# Patient Record
Sex: Female | Born: 1985
Health system: Southern US, Community
[De-identification: ages and names within clinical notes are randomized; demographics above are authoritative.]

## PROBLEM LIST (undated history)

## (undated) DIAGNOSIS — N760 Acute vaginitis: Secondary | ICD-10-CM

## (undated) DIAGNOSIS — A599 Trichomoniasis, unspecified: Secondary | ICD-10-CM

## (undated) DIAGNOSIS — B9689 Other specified bacterial agents as the cause of diseases classified elsewhere: Secondary | ICD-10-CM

---
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
## 2007-11-18 ENCOUNTER — Emergency Department (HOSPITAL_COMMUNITY): Admission: EM | Admit: 2007-11-18 | Discharge: 2007-11-18 | Payer: Self-pay | Admitting: Emergency Medicine

## 2010-05-09 LAB — RUBELLA ANTIBODY, IGM: Rubella: IMMUNE

## 2010-05-09 LAB — HEPATITIS B SURFACE ANTIGEN: Hepatitis B Surface Ag: NEGATIVE

## 2010-05-09 LAB — RPR
RPR: NONREACTIVE
RPR: NONREACTIVE

## 2010-06-03 ENCOUNTER — Other Ambulatory Visit: Payer: Self-pay | Admitting: Obstetrics and Gynecology

## 2010-06-03 DIAGNOSIS — Z0489 Encounter for examination and observation for other specified reasons: Secondary | ICD-10-CM

## 2010-06-03 DIAGNOSIS — R772 Abnormality of alphafetoprotein: Secondary | ICD-10-CM

## 2010-06-05 ENCOUNTER — Other Ambulatory Visit: Payer: Self-pay | Admitting: Obstetrics and Gynecology

## 2010-06-05 ENCOUNTER — Ambulatory Visit (HOSPITAL_COMMUNITY)
Admission: RE | Admit: 2010-06-05 | Discharge: 2010-06-05 | Disposition: A | Discharge status: Home / Self Care | Payer: Medicaid Other | Source: Ambulatory Visit | Attending: Obstetrics and Gynecology | Admitting: Obstetrics and Gynecology

## 2010-06-05 DIAGNOSIS — R772 Abnormality of alphafetoprotein: Secondary | ICD-10-CM

## 2010-06-05 DIAGNOSIS — Z363 Encounter for antenatal screening for malformations: Secondary | ICD-10-CM | POA: Insufficient documentation

## 2010-06-05 DIAGNOSIS — O358XX0 Maternal care for other (suspected) fetal abnormality and damage, not applicable or unspecified: Secondary | ICD-10-CM | POA: Insufficient documentation

## 2010-06-05 DIAGNOSIS — Z0489 Encounter for examination and observation for other specified reasons: Secondary | ICD-10-CM

## 2010-06-05 DIAGNOSIS — O3500X0 Maternal care for (suspected) central nervous system malformation or damage in fetus, unspecified, not applicable or unspecified: Secondary | ICD-10-CM | POA: Insufficient documentation

## 2010-06-05 DIAGNOSIS — O350XX0 Maternal care for (suspected) central nervous system malformation in fetus, not applicable or unspecified: Secondary | ICD-10-CM | POA: Insufficient documentation

## 2010-06-05 DIAGNOSIS — O34219 Maternal care for unspecified type scar from previous cesarean delivery: Secondary | ICD-10-CM | POA: Insufficient documentation

## 2010-06-05 DIAGNOSIS — IMO0002 Reserved for concepts with insufficient information to code with codable children: Secondary | ICD-10-CM

## 2010-06-05 DIAGNOSIS — Z1389 Encounter for screening for other disorder: Secondary | ICD-10-CM | POA: Insufficient documentation

## 2010-06-05 DIAGNOSIS — IMO0001 Reserved for inherently not codable concepts without codable children: Secondary | ICD-10-CM | POA: Insufficient documentation

## 2010-06-13 NOTE — Discharge Summary (Signed)
NAME:  Sara Coleman, Sara Coleman                           ACCOUNT NO.:  [id number]   MEDICAL RECORD NO.:  [id number]                   PATIENT TYPE:  INP   LOCATION:  9111                                 FACILITY:  [facility]   PHYSICIAN:  Lawerance Sabal, MD                   DATE OF BIRTH:  February 16, 1985   DATE OF ADMISSION:  04/26/2003  DATE OF DISCHARGE:  04/29/2003                                 DISCHARGE SUMMARY   DISCHARGE DIAGNOSES:  1. Intrauterine pregnancy, delivered, term, cephalic, live birth.  2. Dysfunctional labor with arrest of dilatation, intrapartal fever.   PROCEDURE:  Primary low transverse Cesarean section.   REVIEW OF HISTORY:  This is a 25 year old, G1, P0, who presented at [redacted] weeks  gestation, in early labor.   On examination, vital signs revealed a blood pressure of 130/71, pulse rate  of 93, respiratory rate 18, temperature 98.6. Examination showed the abdomen  in large age of gestation. Cervix was noted to be 2-3 cm dilated, 70%  effaced, -2 station with intact bag of waters. Heart tracing showed baseline  fetal heart rate of 130 with variability and occasional variable  decelerations. Contractions were coming every four to six minutes, mild to  moderate, lasting 30 to 40 seconds in duration. Assessment was a 17-year-  old, G1, P0, intrauterine pregnancy at 40 weeks and 1 day, in early labor.   HOSPITAL COURSE:  The patient's labor was observed, and ampicillin IV was  given because of GBS positive results. An intrauterine pregnancy catheter  was inserted and Pitocin was started to augment contractions. Fetal heart  rate tracing was noted to have decreased variability; however, there was  note of accelerations with decelerations.  However, in spite of adequate MVU  for two and a half hours the patient remained at 6 cm dilated for two and a  half hours. At this time the patient was noted to be febrile with a  temperature of 101.8. Fetal heart tracings were noted to shift from  a  baseline of 145 to 160 to 170 with variable decelerations and reduced  variability.  Thus the patient underwent a primary segment Cesarean section  for arrest of dilatation and delivered a live baby girl with Apgar score of  8 becoming 9 with a cord pH of 7.31. There was note of cloudy amniotic  fluid. The patient was started on ampicillin 2 gm IV and gentamicin 100 mg  IV, followed by 160 mg IV q.8h. for two doses postoperatively. On the second  postoperative day, the patient became afebrile with no complaints.  The  uterus was nontender. Postoperative hemoglobin was 8.3. The patient was  started on iron supplements.   CONDITION ON DISCHARGE:  The patient was discharged with stable vital signs,  afebrile with __________ and ambulating well. The incision was noted to be  well opposed with no discharge, tenderness, or erythema. There  was no  abdominal tenderness.   DISPOSITION:  The patient is discharged to home.   DISCHARGE INSTRUCTIONS:  The patient is to follow up with Dr. Evonnie Pat at the  [facility] after six weeks. The patient was undecided with  regards to birth control on the day of discharge. Dr. Evonnie Pat will address  this on her postpartum visit.   DISCHARGE MEDICATIONS:  1. Ibuprofen 600 mg p.o. q.i.d. p.r.n. pain.  2. Percocet one tablet p.o. q.i.d. if necessary pain.  3. Ferrous sulfate t.i.d.                                               Lawerance Sabal, MD    MC/MEDQ  D:  05/25/2003  T:  05/25/2003  Job:  [id number]

## 2010-06-13 NOTE — Op Note (Signed)
NAME:  Sara Coleman, Sara Coleman                           ACCOUNT NO.:  [id number]   MEDICAL RECORD NO.:  [id number]                   PATIENT TYPE:  INP   LOCATION:  9111                                 FACILITY:  [facility]   PHYSICIAN:  Lesly Dukes, M.D.              DATE OF BIRTH:  August 27, 1985   DATE OF PROCEDURE:  04/26/2003  DATE OF DISCHARGE:                                 OPERATIVE REPORT   PREOPERATIVE DIAGNOSIS:  A 25 year old para 0 female at term with __________  maternal temperature.   POSTOPERATIVE DIAGNOSIS:  A 25 year old para 0 female at term with  __________ maternal temperature.   PROCEDURE:  Primary low flap transverse cesarean section.   SURGEON:  Lesly Dukes, M.D.   ASSISTANT:  Dr. Miles Costain.   ANESTHESIA:  Epidural.   ESTIMATED BLOOD LOSS:  800 cc.   COMPLICATIONS:  None.   PATHOLOGY:  Placenta.   FINDINGS:  Viable female infant, Apgars 8 at one minute, 9 at five minutes.  Arterial cord pH equals 7.31; vertex; cloudy amniotic fluid; ROA  presentation; peds at delivery; grossly normal uterus, ovaries and fallopian  tubes.   PROCEDURE:  After informed consent was obtained, the patient was taken to  the operating room where epidural anesthesia was found to be adequate.  The  patient was placed in the dorsal supine position with a leftward tilt.  Foley was already in the bladder.  The patient was then prepared and draped  in the normal sterile fashion.  A Pfannenstiel skin incision was made with a  scalpel.  This was carried down to the underlying layer of fascia.  The  fascia was incised in the midline, and this was extended bilaterally.  The  superior-inferior aspects of the fascial incision were grasped with Kocher  clamps, tented up, and dissected off sharply and bluntly from the underlying  layers of rectus muscles.  The rectus muscles were separated in the midline.  The peritoneum was identified, tented up, entered sharply with Metzenbaum  scissors.   This incision was extended both superiorly and inferiorly with  good visualization of the bladder.  The bladder blade was inserted.  The  vesicouterine peritoneum was identified, tented up, entered sharply with  Metzenbaum scissors.  This incision was extended bilaterally.  The bladder  flap was created digitally.  The bladder blade was reinserted.  The uterine  incision was made in a transverse fashion in the lower uterine segment with  a scalpel.  This incision was extended bilaterally bluntly.  The baby's head  was delivered without incident.  Nose and mouth were suctioned.  The rest of  the baby's body delivered easily.  The cord was clamped and cut.  The baby  was handed off to the awaiting pediatrician.   Cord blood was sent for type and screen.  Cord gas was also sent.  The  findings were as above.  The placenta delivered spontaneously with three-  vessel cord and sent to pathology.  The uterus was exteriorized and cleared  of all clots and debris.  Then 0 Vicryl was used to close the uterine  incision in a running locked fashion.  A second imbricating layer was then  used to help aid in hemostasis.  The uterus was returned to the abdomen and  noted to be hemostatic off tension.  The gutters were cleared of all clots  and debris.  The rectus muscle, fascia, bladder flap and peritoneum were  noted to be hemostatic.  The fascia was closed with 0 Vicryl in a running  fashion.  The subcutaneous tissue was copiously irrigated.  The skin was  closed with staples.   The patient tolerated the procedure well.  Sponge, lap, instrument and  needle count were correct x 2.  The patient went to the recovery room in  stable condition.                                               Lesly Dukes, M.D.    Lora Paula  D:  04/26/2003  T:  04/27/2003  Job:  [id number]

## 2010-06-18 ENCOUNTER — Other Ambulatory Visit (HOSPITAL_COMMUNITY): Payer: Self-pay

## 2010-06-18 ENCOUNTER — Encounter (HOSPITAL_COMMUNITY): Payer: Self-pay

## 2010-07-17 ENCOUNTER — Other Ambulatory Visit: Payer: Self-pay | Admitting: Obstetrics and Gynecology

## 2010-07-17 ENCOUNTER — Ambulatory Visit (HOSPITAL_COMMUNITY)
Admission: RE | Admit: 2010-07-17 | Discharge: 2010-07-17 | Disposition: A | Discharge status: Home / Self Care | Payer: Medicaid Other | Source: Ambulatory Visit | Attending: Obstetrics and Gynecology | Admitting: Obstetrics and Gynecology

## 2010-07-17 DIAGNOSIS — IMO0001 Reserved for inherently not codable concepts without codable children: Secondary | ICD-10-CM | POA: Insufficient documentation

## 2010-07-17 DIAGNOSIS — Z0489 Encounter for examination and observation for other specified reasons: Secondary | ICD-10-CM

## 2010-07-17 DIAGNOSIS — O350XX0 Maternal care for (suspected) central nervous system malformation in fetus, not applicable or unspecified: Secondary | ICD-10-CM | POA: Insufficient documentation

## 2010-07-17 DIAGNOSIS — O34219 Maternal care for unspecified type scar from previous cesarean delivery: Secondary | ICD-10-CM | POA: Insufficient documentation

## 2010-07-17 DIAGNOSIS — O3500X0 Maternal care for (suspected) central nervous system malformation or damage in fetus, unspecified, not applicable or unspecified: Secondary | ICD-10-CM | POA: Insufficient documentation

## 2010-07-17 DIAGNOSIS — O358XX0 Maternal care for other (suspected) fetal abnormality and damage, not applicable or unspecified: Secondary | ICD-10-CM | POA: Insufficient documentation

## 2010-08-07 ENCOUNTER — Ambulatory Visit (HOSPITAL_COMMUNITY)
Admission: RE | Admit: 2010-08-07 | Discharge: 2010-08-07 | Disposition: A | Discharge status: Home / Self Care | Payer: Medicaid Other | Source: Ambulatory Visit | Attending: Obstetrics and Gynecology | Admitting: Obstetrics and Gynecology

## 2010-08-07 ENCOUNTER — Encounter (HOSPITAL_COMMUNITY): Payer: Self-pay

## 2010-08-07 DIAGNOSIS — Z0489 Encounter for examination and observation for other specified reasons: Secondary | ICD-10-CM

## 2010-08-07 DIAGNOSIS — O34219 Maternal care for unspecified type scar from previous cesarean delivery: Secondary | ICD-10-CM | POA: Insufficient documentation

## 2010-08-07 DIAGNOSIS — IMO0002 Reserved for concepts with insufficient information to code with codable children: Secondary | ICD-10-CM

## 2010-08-07 DIAGNOSIS — IMO0001 Reserved for inherently not codable concepts without codable children: Secondary | ICD-10-CM | POA: Insufficient documentation

## 2010-08-07 DIAGNOSIS — O350XX0 Maternal care for (suspected) central nervous system malformation in fetus, not applicable or unspecified: Secondary | ICD-10-CM | POA: Insufficient documentation

## 2010-08-07 DIAGNOSIS — O3500X0 Maternal care for (suspected) central nervous system malformation or damage in fetus, unspecified, not applicable or unspecified: Secondary | ICD-10-CM | POA: Insufficient documentation

## 2010-08-07 DIAGNOSIS — O358XX0 Maternal care for other (suspected) fetal abnormality and damage, not applicable or unspecified: Secondary | ICD-10-CM

## 2010-08-28 ENCOUNTER — Ambulatory Visit (HOSPITAL_COMMUNITY)
Admission: RE | Admit: 2010-08-28 | Discharge: 2010-08-28 | Disposition: A | Discharge status: Home / Self Care | Payer: Medicaid Other | Source: Ambulatory Visit | Attending: Obstetrics and Gynecology | Admitting: Obstetrics and Gynecology

## 2010-08-28 ENCOUNTER — Other Ambulatory Visit (HOSPITAL_COMMUNITY): Payer: Self-pay | Admitting: Maternal and Fetal Medicine

## 2010-08-28 VITALS — BP 122/71 | HR 81 | Wt 164.0 lb

## 2010-08-28 DIAGNOSIS — IMO0001 Reserved for inherently not codable concepts without codable children: Secondary | ICD-10-CM | POA: Insufficient documentation

## 2010-08-28 DIAGNOSIS — O34219 Maternal care for unspecified type scar from previous cesarean delivery: Secondary | ICD-10-CM | POA: Insufficient documentation

## 2010-08-28 DIAGNOSIS — O358XX0 Maternal care for other (suspected) fetal abnormality and damage, not applicable or unspecified: Secondary | ICD-10-CM

## 2010-08-28 DIAGNOSIS — O3500X0 Maternal care for (suspected) central nervous system malformation or damage in fetus, unspecified, not applicable or unspecified: Secondary | ICD-10-CM | POA: Insufficient documentation

## 2010-08-28 DIAGNOSIS — O350XX0 Maternal care for (suspected) central nervous system malformation in fetus, not applicable or unspecified: Secondary | ICD-10-CM | POA: Insufficient documentation

## 2010-08-28 NOTE — Progress Notes (Signed)
Report in [facility]-OBGYN/EPIC; follow-up 4 weeks.

## 2010-09-03 ENCOUNTER — Ambulatory Visit (HOSPITAL_COMMUNITY)
Admission: RE | Admit: 2010-09-03 | Discharge: 2010-09-03 | Disposition: A | Discharge status: Home / Self Care | Payer: Medicaid Other | Source: Ambulatory Visit | Attending: Obstetrics & Gynecology | Admitting: Obstetrics & Gynecology

## 2010-09-03 ENCOUNTER — Other Ambulatory Visit (HOSPITAL_COMMUNITY): Payer: Self-pay | Admitting: Obstetrics & Gynecology

## 2010-09-03 DIAGNOSIS — O409XX0 Polyhydramnios, unspecified trimester, not applicable or unspecified: Secondary | ICD-10-CM | POA: Insufficient documentation

## 2010-09-03 DIAGNOSIS — O358XX0 Maternal care for other (suspected) fetal abnormality and damage, not applicable or unspecified: Secondary | ICD-10-CM

## 2010-09-03 DIAGNOSIS — IMO0001 Reserved for inherently not codable concepts without codable children: Secondary | ICD-10-CM | POA: Insufficient documentation

## 2010-09-03 DIAGNOSIS — O34219 Maternal care for unspecified type scar from previous cesarean delivery: Secondary | ICD-10-CM | POA: Insufficient documentation

## 2010-09-03 DIAGNOSIS — O3500X0 Maternal care for (suspected) central nervous system malformation or damage in fetus, unspecified, not applicable or unspecified: Secondary | ICD-10-CM | POA: Insufficient documentation

## 2010-09-03 DIAGNOSIS — O350XX0 Maternal care for (suspected) central nervous system malformation in fetus, not applicable or unspecified: Secondary | ICD-10-CM | POA: Insufficient documentation

## 2010-09-03 NOTE — Progress Notes (Signed)
Patient seen for ultrasound only appointment today.  Please see [facility]-OBGYN report for details.  

## 2010-09-09 ENCOUNTER — Inpatient Hospital Stay (HOSPITAL_COMMUNITY)
Admission: AD | Admit: 2010-09-09 | Discharge: 2010-09-09 | Disposition: A | Discharge status: Home / Self Care | Payer: Medicaid Other | Source: Ambulatory Visit | Attending: Obstetrics and Gynecology | Admitting: Obstetrics and Gynecology

## 2010-09-09 ENCOUNTER — Encounter (HOSPITAL_COMMUNITY): Payer: Self-pay | Admitting: *Deleted

## 2010-09-09 DIAGNOSIS — Z36 Encounter for antenatal screening of mother: Secondary | ICD-10-CM

## 2010-09-09 DIAGNOSIS — O368390 Maternal care for abnormalities of the fetal heart rate or rhythm, unspecified trimester, not applicable or unspecified: Secondary | ICD-10-CM | POA: Insufficient documentation

## 2010-09-09 DIAGNOSIS — Z3689 Encounter for other specified antenatal screening: Secondary | ICD-10-CM

## 2010-09-09 DIAGNOSIS — O4700 False labor before 37 completed weeks of gestation, unspecified trimester: Secondary | ICD-10-CM | POA: Insufficient documentation

## 2010-09-09 LAB — FETAL FIBRONECTIN: Fetal Fibronectin: NEGATIVE

## 2010-09-09 NOTE — ED Provider Notes (Signed)
History     No chief complaint on file.  HPI Presents from office for extended monitoring due to a FHR decel seen in office. Feels some tightening but no painful contractions. No leaking or bleeding. + FM   Past Medical History  Diagnosis Date  . Migraine     Past Surgical History  Procedure Date  . Cesarean section     No family history on file.  History  Substance Use Topics  . Smoking status: Never Smoker   . Smokeless tobacco: Not on file  . Alcohol Use: No    Allergies: No Known Allergies  Prescriptions prior to admission  Medication Sig Dispense Refill  . acetaminophen (TYLENOL) 325 MG tablet Take 650 mg by mouth every 6 (six) hours as needed. As needed for pain       . Prenatal MV-Min-Fe Fum-FA-DHA (PRENATAL 1 PO) Take 1 tablet by mouth daily.         ROS Negative except tightening.  Physical Exam   Blood pressure 120/65, pulse 78, temperature 99 F (37.2 C), temperature source Oral, resp. rate 18, height 5' 4.25" (1.632 m), weight 76.261 kg (168 lb 2 oz).  Physical Exam  Nursing note and vitals reviewed. Constitutional: She is oriented to person, place, and time. She appears well-developed and well-nourished.  HENT:  Head: Normocephalic.  Cardiovascular: Normal rate.   Respiratory: Effort normal.  GI: Soft.  Genitourinary: Vagina normal. No vaginal discharge found.  Neurological: She is alert and oriented to person, place, and time.  Skin: Skin is warm and dry.  Psychiatric: She has a normal mood and affect.  FHR reassuring with no decels at present. UCs q 3 minutes, not perceived as painful Cervix FT/50%/-3/?presenting part   MAU Course  Procedures  MDM   Assessment and Plan  Discussed with Dr Chilton Si. WIll send FFn and monitor for at least one hour.   Ovetta Bazzano 09/09/2010, 5:22 PM   FHR reactive with no decels. There are scattered dips of 10-20 beats that last only 10 seconds. UCs are irregular FFn is Negative D/W Dr Chilton Si >> will  send home.

## 2010-09-09 NOTE — Progress Notes (Signed)
Pt states, " I was at the office at 1430 today, and the baby had a drop in hear trate while doing an NST."

## 2010-09-25 ENCOUNTER — Ambulatory Visit (HOSPITAL_COMMUNITY)
Admission: RE | Admit: 2010-09-25 | Discharge: 2010-09-25 | Disposition: A | Discharge status: Home / Self Care | Payer: Medicaid Other | Source: Ambulatory Visit | Attending: Obstetrics and Gynecology | Admitting: Obstetrics and Gynecology

## 2010-09-25 ENCOUNTER — Other Ambulatory Visit: Payer: Self-pay | Admitting: Obstetrics and Gynecology

## 2010-09-25 DIAGNOSIS — O358XX0 Maternal care for other (suspected) fetal abnormality and damage, not applicable or unspecified: Secondary | ICD-10-CM | POA: Insufficient documentation

## 2010-09-25 DIAGNOSIS — IMO0001 Reserved for inherently not codable concepts without codable children: Secondary | ICD-10-CM

## 2010-09-25 DIAGNOSIS — O3500X0 Maternal care for (suspected) central nervous system malformation or damage in fetus, unspecified, not applicable or unspecified: Secondary | ICD-10-CM | POA: Insufficient documentation

## 2010-09-25 DIAGNOSIS — O409XX0 Polyhydramnios, unspecified trimester, not applicable or unspecified: Secondary | ICD-10-CM | POA: Insufficient documentation

## 2010-09-25 DIAGNOSIS — O34219 Maternal care for unspecified type scar from previous cesarean delivery: Secondary | ICD-10-CM | POA: Insufficient documentation

## 2010-09-25 DIAGNOSIS — O350XX0 Maternal care for (suspected) central nervous system malformation in fetus, not applicable or unspecified: Secondary | ICD-10-CM | POA: Insufficient documentation

## 2010-09-25 NOTE — Progress Notes (Signed)
Vital signs reviewed; see ultrasound report 

## 2010-10-03 ENCOUNTER — Ambulatory Visit (HOSPITAL_COMMUNITY)
Admission: RE | Admit: 2010-10-03 | Discharge: 2010-10-03 | Disposition: A | Discharge status: Home / Self Care | Payer: Medicaid Other | Source: Ambulatory Visit | Attending: Obstetrics and Gynecology | Admitting: Obstetrics and Gynecology

## 2010-10-03 DIAGNOSIS — IMO0001 Reserved for inherently not codable concepts without codable children: Secondary | ICD-10-CM

## 2010-10-03 DIAGNOSIS — O365990 Maternal care for other known or suspected poor fetal growth, unspecified trimester, not applicable or unspecified: Secondary | ICD-10-CM | POA: Insufficient documentation

## 2010-10-03 DIAGNOSIS — O409XX0 Polyhydramnios, unspecified trimester, not applicable or unspecified: Secondary | ICD-10-CM

## 2010-10-03 DIAGNOSIS — O358XX0 Maternal care for other (suspected) fetal abnormality and damage, not applicable or unspecified: Secondary | ICD-10-CM

## 2010-10-03 DIAGNOSIS — O350XX0 Maternal care for (suspected) central nervous system malformation in fetus, not applicable or unspecified: Secondary | ICD-10-CM

## 2010-10-03 DIAGNOSIS — O34219 Maternal care for unspecified type scar from previous cesarean delivery: Secondary | ICD-10-CM

## 2010-10-03 DIAGNOSIS — O3500X0 Maternal care for (suspected) central nervous system malformation or damage in fetus, unspecified, not applicable or unspecified: Secondary | ICD-10-CM

## 2010-10-03 NOTE — Progress Notes (Signed)
Ultrasound in [facility]/OBGYN/EPIC.  Follow up U/S scheduled 

## 2010-10-10 ENCOUNTER — Ambulatory Visit (HOSPITAL_COMMUNITY)
Admission: RE | Admit: 2010-10-10 | Discharge: 2010-10-10 | Disposition: A | Discharge status: Home / Self Care | Payer: Medicaid Other | Source: Ambulatory Visit | Attending: Obstetrics and Gynecology | Admitting: Obstetrics and Gynecology

## 2010-10-10 DIAGNOSIS — O358XX0 Maternal care for other (suspected) fetal abnormality and damage, not applicable or unspecified: Secondary | ICD-10-CM | POA: Insufficient documentation

## 2010-10-10 DIAGNOSIS — O365990 Maternal care for other known or suspected poor fetal growth, unspecified trimester, not applicable or unspecified: Secondary | ICD-10-CM | POA: Insufficient documentation

## 2010-10-10 DIAGNOSIS — O409XX0 Polyhydramnios, unspecified trimester, not applicable or unspecified: Secondary | ICD-10-CM | POA: Insufficient documentation

## 2010-10-10 DIAGNOSIS — O34219 Maternal care for unspecified type scar from previous cesarean delivery: Secondary | ICD-10-CM | POA: Insufficient documentation

## 2010-10-10 DIAGNOSIS — O3500X0 Maternal care for (suspected) central nervous system malformation or damage in fetus, unspecified, not applicable or unspecified: Secondary | ICD-10-CM | POA: Insufficient documentation

## 2010-10-10 DIAGNOSIS — O350XX0 Maternal care for (suspected) central nervous system malformation in fetus, not applicable or unspecified: Secondary | ICD-10-CM | POA: Insufficient documentation

## 2010-10-10 DIAGNOSIS — IMO0001 Reserved for inherently not codable concepts without codable children: Secondary | ICD-10-CM | POA: Insufficient documentation

## 2010-10-10 NOTE — Progress Notes (Signed)
Patient seen for ultrasound only appointment today.  Please see [facility]-OBGYN report for details.  

## 2010-10-13 NOTE — Progress Notes (Signed)
Encounter addended by: Marlana Latus, RN on: 10/13/2010  2:27 PM<BR>     Documentation filed: Episodes

## 2010-10-17 ENCOUNTER — Ambulatory Visit (HOSPITAL_COMMUNITY)
Admission: RE | Admit: 2010-10-17 | Discharge: 2010-10-17 | Disposition: A | Discharge status: Home / Self Care | Payer: Medicaid Other | Source: Ambulatory Visit | Attending: Obstetrics and Gynecology | Admitting: Obstetrics and Gynecology

## 2010-10-17 DIAGNOSIS — IMO0001 Reserved for inherently not codable concepts without codable children: Secondary | ICD-10-CM | POA: Insufficient documentation

## 2010-10-17 DIAGNOSIS — O365990 Maternal care for other known or suspected poor fetal growth, unspecified trimester, not applicable or unspecified: Secondary | ICD-10-CM | POA: Insufficient documentation

## 2010-10-17 DIAGNOSIS — O350XX0 Maternal care for (suspected) central nervous system malformation in fetus, not applicable or unspecified: Secondary | ICD-10-CM

## 2010-10-17 DIAGNOSIS — O3500X0 Maternal care for (suspected) central nervous system malformation or damage in fetus, unspecified, not applicable or unspecified: Secondary | ICD-10-CM | POA: Insufficient documentation

## 2010-10-17 DIAGNOSIS — O34219 Maternal care for unspecified type scar from previous cesarean delivery: Secondary | ICD-10-CM | POA: Insufficient documentation

## 2010-10-17 DIAGNOSIS — O409XX0 Polyhydramnios, unspecified trimester, not applicable or unspecified: Secondary | ICD-10-CM | POA: Insufficient documentation

## 2010-10-17 DIAGNOSIS — O358XX0 Maternal care for other (suspected) fetal abnormality and damage, not applicable or unspecified: Secondary | ICD-10-CM

## 2010-10-17 NOTE — Progress Notes (Signed)
Ultrasound in [facility]/OBGYN/EPIC.  Follow up U/S scheduled 

## 2010-10-22 NOTE — Progress Notes (Signed)
Encounter addended by: Marlana Latus, RN on: 10/22/2010  3:51 PM<BR>     Documentation filed: Episodes, Chief Complaint Section

## 2010-10-24 ENCOUNTER — Other Ambulatory Visit (HOSPITAL_COMMUNITY): Payer: Self-pay | Admitting: Maternal and Fetal Medicine

## 2010-10-24 ENCOUNTER — Ambulatory Visit (HOSPITAL_COMMUNITY)
Admission: RE | Admit: 2010-10-24 | Discharge: 2010-10-24 | Disposition: A | Discharge status: Home / Self Care | Payer: Medicaid Other | Source: Ambulatory Visit | Attending: Obstetrics and Gynecology | Admitting: Obstetrics and Gynecology

## 2010-10-24 DIAGNOSIS — O358XX0 Maternal care for other (suspected) fetal abnormality and damage, not applicable or unspecified: Secondary | ICD-10-CM | POA: Insufficient documentation

## 2010-10-24 DIAGNOSIS — O350XX0 Maternal care for (suspected) central nervous system malformation in fetus, not applicable or unspecified: Secondary | ICD-10-CM

## 2010-10-24 DIAGNOSIS — O409XX0 Polyhydramnios, unspecified trimester, not applicable or unspecified: Secondary | ICD-10-CM | POA: Insufficient documentation

## 2010-10-24 DIAGNOSIS — O365990 Maternal care for other known or suspected poor fetal growth, unspecified trimester, not applicable or unspecified: Secondary | ICD-10-CM | POA: Insufficient documentation

## 2010-10-24 DIAGNOSIS — O34219 Maternal care for unspecified type scar from previous cesarean delivery: Secondary | ICD-10-CM | POA: Insufficient documentation

## 2010-10-24 DIAGNOSIS — O3500X0 Maternal care for (suspected) central nervous system malformation or damage in fetus, unspecified, not applicable or unspecified: Secondary | ICD-10-CM | POA: Insufficient documentation

## 2010-10-24 DIAGNOSIS — IMO0001 Reserved for inherently not codable concepts without codable children: Secondary | ICD-10-CM | POA: Insufficient documentation

## 2010-10-27 ENCOUNTER — Encounter (HOSPITAL_COMMUNITY): Payer: Self-pay | Admitting: *Deleted

## 2010-10-27 ENCOUNTER — Inpatient Hospital Stay (HOSPITAL_COMMUNITY): Payer: Medicaid Other

## 2010-10-27 ENCOUNTER — Inpatient Hospital Stay (HOSPITAL_COMMUNITY)
Admission: AD | Admit: 2010-10-27 | Discharge: 2010-10-30 | DRG: 765 | Disposition: A | Discharge status: Home / Self Care | Payer: Medicaid Other | Source: Ambulatory Visit | Attending: Obstetrics and Gynecology | Admitting: Obstetrics and Gynecology

## 2010-10-27 DIAGNOSIS — O351XX0 Maternal care for (suspected) chromosomal abnormality in fetus, not applicable or unspecified: Secondary | ICD-10-CM | POA: Diagnosis present

## 2010-10-27 DIAGNOSIS — O3510X0 Maternal care for (suspected) chromosomal abnormality in fetus, unspecified, not applicable or unspecified: Secondary | ICD-10-CM | POA: Diagnosis present

## 2010-10-27 DIAGNOSIS — O34219 Maternal care for unspecified type scar from previous cesarean delivery: Secondary | ICD-10-CM | POA: Diagnosis present

## 2010-10-27 DIAGNOSIS — O409XX0 Polyhydramnios, unspecified trimester, not applicable or unspecified: Secondary | ICD-10-CM | POA: Diagnosis present

## 2010-10-27 DIAGNOSIS — O364XX0 Maternal care for intrauterine death, not applicable or unspecified: Principal | ICD-10-CM | POA: Diagnosis present

## 2010-10-27 DIAGNOSIS — O2690 Pregnancy related conditions, unspecified, unspecified trimester: Secondary | ICD-10-CM

## 2010-10-27 LAB — CBC
MCH: 28.9 pg (ref 26.0–34.0)
MCHC: 33.1 g/dL (ref 30.0–36.0)
Platelets: 213 10*3/uL (ref 150–400)

## 2010-10-27 MED ORDER — CEFAZOLIN SODIUM-DEXTROSE 2-3 GM-% IV SOLR
2.0000 g | INTRAVENOUS | Status: DC
  Filled 2010-10-27: qty 50

## 2010-10-27 MED ORDER — LACTATED RINGERS IV SOLN
INTRAVENOUS | Status: DC
  Administered 2010-10-27 – 2010-10-28 (×2): via INTRAVENOUS

## 2010-10-27 MED ORDER — ACETAMINOPHEN 500 MG PO TABS
1000.0000 mg | ORAL_TABLET | Freq: Once | ORAL | Status: AC
  Administered 2010-10-27: 1000 mg via ORAL
  Filled 2010-10-27: qty 2

## 2010-10-27 NOTE — Progress Notes (Signed)
Pt G3 P1 at 39.5wks, reports no fetal movement since Saturday night.

## 2010-10-27 NOTE — Progress Notes (Signed)
[place] tech at bedside per MD orders.

## 2010-10-27 NOTE — Progress Notes (Signed)
No cardiac activity seen on [place].

## 2010-10-27 NOTE — Progress Notes (Signed)
Dr. Paul Half at bedside. Assessment done and poc discussed with pt.

## 2010-10-27 NOTE — H&P (Signed)
Principal Diagnosis:  IUP at [redacted]w[redacted]d, IUFD, multiple fetal anomalies, h/o cesarean section.  HPI:  Patient presented to MAU with cc of no fetal movement for 4 days.  The patient denies vaginal bleeding, contractions, leakage of fluid.    PMH:  None PSH:  H/o cesarean section POB:  G3P1011, h/o c/s for breech x1, TAB x1. PGYN:  No h/o STD's or abnormal paps in prenatal records Meds:  None Allergies:  NKDA  PE:    Vitals:  99.3, 129/84, 90, 18. RRR CTAB Gravid, nontender. LE's:  Non tender, homan's neg, no evid dvt.     WBC:  7.5, H/H  12/36.3, PLT 213.  Coags:  PTT  26, PT 12.2, T&S pending.    Prenatal Labs:  O+/RI/NR/pap wnl.  Bedside U/S:  PA performed u/s previously that demonstrated no fetal heart tones.  Official u/s requested; performed while I was observing in the room.  There was no FHR detected on u/s.  No fetal movement.  No overlapping skull bones, mild skin edema.  There is a pleural effusion present as well.  A/P:  I discussed the findings of the u/s with the patient and her family.  The patient and I had a long discussion regarding options for delivery.  I discussed the risks of VBAC vs. Repeat C/S.  The R/B/A's of each were discussed, keeping in mind the fact that our exclusive focus would be on the patient in labor rather than the fetus.  I discussed the risk of uterine rupture and of subsequent potential hemorrhage, but noted that these are low risks.  Additionally, I discussed the risks of c/s including bleeding, infection, possible transfusion, risks of anesthesia, medical complications of surgery including blood clots, stroke, heart attack, death, damage to internal abdominal structures.  After this discussion, the patient opted for repeat c/s.  I feel that, given the difficult circumstances in which the patient and family now find themselves, this is a reasonable option.  The patient ate a sandwich and potato chips at 6:30 p.m.  I discussed this with anesthesia and the  plan is to bring the patient back to the operating room at 6:30 a.m.  Routine labs, including coags ordered.  NPO.  Will proceed with c/s in the a.m.  Will discuss with patient option of obtaining tissue sample for genetic analysis if desired.  Patient and family informed of options, including burial vs hospital disposing of the fetus.

## 2010-10-27 NOTE — Progress Notes (Signed)
Jenean Lindau, PA at bedside.  Bedside US done.

## 2010-10-27 NOTE — Progress Notes (Signed)
Pt to room 172 via wheelchair.

## 2010-10-27 NOTE — ED Notes (Signed)
Unable to dopple FHR, pt to room 4 for informal U/S.

## 2010-10-27 NOTE — Progress Notes (Signed)
Pt go to room 172. Will call when room is ready.

## 2010-10-28 ENCOUNTER — Encounter (HOSPITAL_COMMUNITY): Payer: Self-pay | Admitting: *Deleted

## 2010-10-28 ENCOUNTER — Other Ambulatory Visit: Payer: Self-pay | Admitting: Obstetrics and Gynecology

## 2010-10-28 ENCOUNTER — Inpatient Hospital Stay (HOSPITAL_COMMUNITY): Payer: Medicaid Other | Admitting: Anesthesiology

## 2010-10-28 ENCOUNTER — Encounter (HOSPITAL_COMMUNITY)
Admission: AD | Disposition: A | Discharge status: Home / Self Care | Payer: Self-pay | Source: Ambulatory Visit | Attending: Obstetrics and Gynecology

## 2010-10-28 ENCOUNTER — Encounter (HOSPITAL_COMMUNITY): Payer: Self-pay | Admitting: Anesthesiology

## 2010-10-28 LAB — TYPE AND SCREEN: Antibody Screen: NEGATIVE

## 2010-10-28 LAB — ABO/RH: ABO/RH(D): O POS

## 2010-10-28 LAB — RPR: RPR Ser Ql: NONREACTIVE

## 2010-10-28 SURGERY — Surgical Case
Anesthesia: Regional | Site: Abdomen | Wound class: Clean Contaminated

## 2010-10-28 MED ORDER — KETOROLAC TROMETHAMINE 60 MG/2ML IM SOLN
INTRAMUSCULAR | Status: AC
  Administered 2010-10-28: 60 mg via INTRAMUSCULAR
  Filled 2010-10-28: qty 2

## 2010-10-28 MED ORDER — SODIUM CHLORIDE 0.9 % IJ SOLN: 3.0000 mL | INTRAMUSCULAR | Status: DC | PRN

## 2010-10-28 MED ORDER — KETOROLAC TROMETHAMINE 30 MG/ML IJ SOLN
30.0000 mg | Freq: Four times a day (QID) | INTRAMUSCULAR | Status: AC | PRN
  Administered 2010-10-28: 30 mg via INTRAVENOUS
  Filled 2010-10-28: qty 1

## 2010-10-28 MED ORDER — ACETAMINOPHEN 10 MG/ML IV SOLN
1000.0000 mg | Freq: Four times a day (QID) | INTRAVENOUS | Status: AC | PRN
  Filled 2010-10-28: qty 100

## 2010-10-28 MED ORDER — SCOPOLAMINE 1 MG/3DAYS TD PT72
MEDICATED_PATCH | TRANSDERMAL | Status: AC
  Administered 2010-10-28: 1.5 mg via TRANSDERMAL
  Filled 2010-10-28: qty 1

## 2010-10-28 MED ORDER — ONDANSETRON HCL 4 MG/2ML IJ SOLN
4.0000 mg | INTRAMUSCULAR | Status: DC | PRN
  Administered 2010-10-28: 4 mg via INTRAVENOUS

## 2010-10-28 MED ORDER — METOCLOPRAMIDE HCL 5 MG/ML IJ SOLN: 10.0000 mg | Freq: Three times a day (TID) | INTRAMUSCULAR | Status: DC | PRN

## 2010-10-28 MED ORDER — KETOROLAC TROMETHAMINE 30 MG/ML IJ SOLN: 30.0000 mg | Freq: Four times a day (QID) | INTRAMUSCULAR | Status: AC | PRN

## 2010-10-28 MED ORDER — LANOLIN HYDROUS EX OINT: 1.0000 "application " | TOPICAL_OINTMENT | CUTANEOUS | Status: DC | PRN

## 2010-10-28 MED ORDER — EPHEDRINE 5 MG/ML INJ
INTRAVENOUS | Status: AC
  Filled 2010-10-28: qty 10

## 2010-10-28 MED ORDER — DIPHENHYDRAMINE HCL 50 MG/ML IJ SOLN: 12.5000 mg | INTRAMUSCULAR | Status: DC | PRN

## 2010-10-28 MED ORDER — FENTANYL CITRATE 0.05 MG/ML IJ SOLN
INTRAMUSCULAR | Status: DC | PRN
  Administered 2010-10-28: 25 ug via INTRATHECAL

## 2010-10-28 MED ORDER — SIMETHICONE 80 MG PO CHEW
80.0000 mg | CHEWABLE_TABLET | ORAL | Status: DC | PRN
  Administered 2010-10-28: 80 mg via ORAL

## 2010-10-28 MED ORDER — WITCH HAZEL-GLYCERIN EX PADS: 1.0000 "application " | MEDICATED_PAD | CUTANEOUS | Status: DC | PRN

## 2010-10-28 MED ORDER — DIPHENHYDRAMINE HCL 25 MG PO CAPS
25.0000 mg | ORAL_CAPSULE | Freq: Four times a day (QID) | ORAL | Status: DC | PRN
  Administered 2010-10-28: 25 mg via ORAL
  Filled 2010-10-28: qty 1

## 2010-10-28 MED ORDER — PHENYLEPHRINE HCL 10 MG/ML IJ SOLN
INTRAMUSCULAR | Status: DC | PRN
  Administered 2010-10-28 (×2): 40 ug via INTRAVENOUS
  Administered 2010-10-28 (×2): 80 ug via INTRAVENOUS
  Administered 2010-10-28 (×7): 40 ug via INTRAVENOUS
  Administered 2010-10-28 (×3): 80 ug via INTRAVENOUS

## 2010-10-28 MED ORDER — KETOROLAC TROMETHAMINE 30 MG/ML IJ SOLN: 15.0000 mg | Freq: Once | INTRAMUSCULAR | Status: DC | PRN

## 2010-10-28 MED ORDER — MENTHOL 3 MG MT LOZG: 1.0000 | LOZENGE | OROMUCOSAL | Status: DC | PRN

## 2010-10-28 MED ORDER — DIPHENHYDRAMINE HCL 50 MG/ML IJ SOLN: 25.0000 mg | INTRAMUSCULAR | Status: DC | PRN

## 2010-10-28 MED ORDER — OXYTOCIN 20 UNITS IN LACTATED RINGERS INFUSION - SIMPLE
INTRAVENOUS | Status: DC | PRN
  Administered 2010-10-28 (×2): 20 [IU] via INTRAVENOUS

## 2010-10-28 MED ORDER — DIPHENHYDRAMINE HCL 25 MG PO CAPS: 25.0000 mg | ORAL_CAPSULE | ORAL | Status: DC | PRN

## 2010-10-28 MED ORDER — OXYTOCIN 10 UNIT/ML IJ SOLN
INTRAMUSCULAR | Status: AC
  Filled 2010-10-28: qty 4

## 2010-10-28 MED ORDER — PHENYLEPHRINE 40 MCG/ML (10ML) SYRINGE FOR IV PUSH (FOR BLOOD PRESSURE SUPPORT)
PREFILLED_SYRINGE | INTRAVENOUS | Status: AC
  Filled 2010-10-28: qty 5

## 2010-10-28 MED ORDER — BUPIVACAINE IN DEXTROSE 0.75-8.25 % IT SOLN
INTRATHECAL | Status: DC | PRN
  Administered 2010-10-28: 11 mg via INTRATHECAL

## 2010-10-28 MED ORDER — EPHEDRINE SULFATE 50 MG/ML IJ SOLN
INTRAMUSCULAR | Status: DC | PRN
  Administered 2010-10-28 (×2): 5 mg via INTRAVENOUS
  Administered 2010-10-28: 10 mg via INTRAVENOUS

## 2010-10-28 MED ORDER — LACTATED RINGERS IV SOLN
INTRAVENOUS | Status: DC
  Administered 2010-10-28: 11:00:00 via INTRAVENOUS

## 2010-10-28 MED ORDER — MORPHINE SULFATE (PF) 0.5 MG/ML IJ SOLN
INTRAMUSCULAR | Status: DC | PRN
  Administered 2010-10-28: 4.9 mg via INTRAVENOUS
  Administered 2010-10-28: .1 mg via INTRATHECAL

## 2010-10-28 MED ORDER — ACETAMINOPHEN 325 MG PO TABS: 325.0000 mg | ORAL_TABLET | ORAL | Status: DC | PRN

## 2010-10-28 MED ORDER — SIMETHICONE 80 MG PO CHEW
80.0000 mg | CHEWABLE_TABLET | Freq: Three times a day (TID) | ORAL | Status: DC
  Administered 2010-10-29 – 2010-10-30 (×5): 80 mg via ORAL

## 2010-10-28 MED ORDER — CEFAZOLIN SODIUM 1-5 GM-% IV SOLN
INTRAVENOUS | Status: AC
  Filled 2010-10-28: qty 100

## 2010-10-28 MED ORDER — ONDANSETRON HCL 4 MG PO TABS: 4.0000 mg | ORAL_TABLET | ORAL | Status: DC | PRN

## 2010-10-28 MED ORDER — NALBUPHINE HCL 10 MG/ML IJ SOLN
5.0000 mg | INTRAMUSCULAR | Status: DC | PRN
  Filled 2010-10-28: qty 1

## 2010-10-28 MED ORDER — SODIUM CHLORIDE 0.9 % IV SOLN
1.0000 ug/kg/h | INTRAVENOUS | Status: DC | PRN
  Filled 2010-10-28: qty 2.5

## 2010-10-28 MED ORDER — OXYTOCIN 20 UNITS IN LACTATED RINGERS INFUSION - SIMPLE
125.0000 mL/h | INTRAVENOUS | Status: AC
  Administered 2010-10-28 (×2): 125 mL/h via INTRAVENOUS
  Filled 2010-10-28 (×3): qty 1000

## 2010-10-28 MED ORDER — FENTANYL CITRATE 0.05 MG/ML IJ SOLN
INTRAMUSCULAR | Status: AC
  Filled 2010-10-28: qty 2

## 2010-10-28 MED ORDER — MEPERIDINE HCL 25 MG/ML IJ SOLN: 6.2500 mg | INTRAMUSCULAR | Status: DC | PRN

## 2010-10-28 MED ORDER — ZOLPIDEM TARTRATE 10 MG PO TABS
10.0000 mg | ORAL_TABLET | Freq: Every evening | ORAL | Status: DC | PRN
  Administered 2010-10-28: 10 mg via ORAL
  Filled 2010-10-28 (×2): qty 1

## 2010-10-28 MED ORDER — CEFAZOLIN SODIUM 1-5 GM-% IV SOLN
INTRAVENOUS | Status: DC | PRN
  Administered 2010-10-28: 2 g via INTRAVENOUS

## 2010-10-28 MED ORDER — OXYCODONE-ACETAMINOPHEN 5-325 MG PO TABS
1.0000 | ORAL_TABLET | ORAL | Status: DC | PRN
  Administered 2010-10-28: 2 via ORAL
  Administered 2010-10-29: 1 via ORAL
  Administered 2010-10-29 (×2): 2 via ORAL
  Administered 2010-10-30: 1 via ORAL
  Filled 2010-10-28 (×3): qty 2
  Filled 2010-10-28 (×3): qty 1

## 2010-10-28 MED ORDER — SCOPOLAMINE 1 MG/3DAYS TD PT72
1.0000 | MEDICATED_PATCH | Freq: Once | TRANSDERMAL | Status: DC
  Filled 2010-10-28: qty 1

## 2010-10-28 MED ORDER — ONDANSETRON HCL 4 MG/2ML IJ SOLN
INTRAMUSCULAR | Status: AC
  Filled 2010-10-28: qty 2

## 2010-10-28 MED ORDER — FENTANYL CITRATE 0.05 MG/ML IJ SOLN: 25.0000 ug | INTRAMUSCULAR | Status: DC | PRN

## 2010-10-28 MED ORDER — ONDANSETRON HCL 4 MG/2ML IJ SOLN
4.0000 mg | Freq: Three times a day (TID) | INTRAMUSCULAR | Status: DC | PRN
  Filled 2010-10-28: qty 2

## 2010-10-28 MED ORDER — PRENATAL PLUS 27-1 MG PO TABS
1.0000 | ORAL_TABLET | Freq: Every day | ORAL | Status: DC
  Administered 2010-10-29 – 2010-10-30 (×2): 1 via ORAL
  Filled 2010-10-28 (×2): qty 1

## 2010-10-28 MED ORDER — LACTATED RINGERS IV SOLN
INTRAVENOUS | Status: DC | PRN
  Administered 2010-10-28 (×2): via INTRAVENOUS

## 2010-10-28 MED ORDER — MIDAZOLAM HCL 5 MG/5ML IJ SOLN
INTRAMUSCULAR | Status: DC | PRN
  Administered 2010-10-28: 2 mg via INTRAVENOUS

## 2010-10-28 MED ORDER — MIDAZOLAM HCL 2 MG/2ML IJ SOLN
INTRAMUSCULAR | Status: AC
  Filled 2010-10-28: qty 2

## 2010-10-28 MED ORDER — NALBUPHINE SYRINGE 5 MG/0.5 ML
5.0000 mg | INJECTION | INTRAMUSCULAR | Status: DC | PRN
  Filled 2010-10-28: qty 1

## 2010-10-28 MED ORDER — MORPHINE SULFATE 0.5 MG/ML IJ SOLN
INTRAMUSCULAR | Status: AC
  Filled 2010-10-28: qty 10

## 2010-10-28 MED ORDER — PROMETHAZINE HCL 25 MG/ML IJ SOLN: 6.2500 mg | INTRAMUSCULAR | Status: DC | PRN

## 2010-10-28 MED ORDER — IBUPROFEN 600 MG PO TABS
600.0000 mg | ORAL_TABLET | Freq: Four times a day (QID) | ORAL | Status: DC
Start: 2010-10-28 — End: 2010-10-30
  Administered 2010-10-28 – 2010-10-30 (×5): 600 mg via ORAL
  Filled 2010-10-28: qty 1

## 2010-10-28 MED ORDER — DIBUCAINE 1 % RE OINT: 1.0000 "application " | TOPICAL_OINTMENT | RECTAL | Status: DC | PRN

## 2010-10-28 MED ORDER — NALOXONE HCL 0.4 MG/ML IJ SOLN: 0.4000 mg | INTRAMUSCULAR | Status: DC | PRN

## 2010-10-28 MED ORDER — INFLUENZA VIRUS VACC SPLIT PF IM SUSP
0.5000 mL | Freq: Once | INTRAMUSCULAR | Status: AC
  Administered 2010-10-29: 1 mL via INTRAMUSCULAR
  Filled 2010-10-28: qty 0.5

## 2010-10-28 MED ORDER — IBUPROFEN 600 MG PO TABS
600.0000 mg | ORAL_TABLET | Freq: Four times a day (QID) | ORAL | Status: DC | PRN
  Administered 2010-10-29: 600 mg via ORAL
  Filled 2010-10-28 (×5): qty 1

## 2010-10-28 MED ORDER — TETANUS-DIPHTH-ACELL PERTUSSIS 5-2.5-18.5 LF-MCG/0.5 IM SUSP
0.5000 mL | Freq: Once | INTRAMUSCULAR | Status: AC
  Administered 2010-10-29: 1 mL via INTRAMUSCULAR
  Filled 2010-10-28: qty 0.5

## 2010-10-28 SURGICAL SUPPLY — 36 items
CLOTH BEACON ORANGE TIMEOUT ST (SAFETY) ×2 IMPLANT
DERMABOND ADVANCED (GAUZE/BANDAGES/DRESSINGS)
DERMABOND ADVANCED .7 DNX12 (GAUZE/BANDAGES/DRESSINGS) IMPLANT
DRESSING TELFA 8X3 (GAUZE/BANDAGES/DRESSINGS) IMPLANT
DRSG COVADERM 4X6 (GAUZE/BANDAGES/DRESSINGS) ×2 IMPLANT
DURAPREP 26ML APPLICATOR (WOUND CARE) ×2 IMPLANT
ELECT REM PT RETURN 9FT ADLT (ELECTROSURGICAL) ×2
ELECTRODE REM PT RTRN 9FT ADLT (ELECTROSURGICAL) ×1 IMPLANT
EXTRACTOR VACUUM M CUP 4 TUBE (SUCTIONS) IMPLANT
GAUZE SPONGE 4X4 12PLY STRL LF (GAUZE/BANDAGES/DRESSINGS) IMPLANT
GLOVE BIO SURGEON STRL SZ 6.5 (GLOVE) IMPLANT
GLOVE BIOGEL PI IND STRL 7.0 (GLOVE) IMPLANT
GLOVE BIOGEL PI INDICATOR 7.0 (GLOVE)
GLOVE LATEX FREE NEOLON SZ 8 (GLOVE) ×6 IMPLANT
GOWN PREVENTION PLUS LG XLONG (DISPOSABLE) ×4 IMPLANT
GOWN PREVENTION PLUS XLARGE (GOWN DISPOSABLE) ×2 IMPLANT
KIT ABG SYR 3ML LUER SLIP (SYRINGE) IMPLANT
NEEDLE HYPO 25X5/8 SAFETYGLIDE (NEEDLE) IMPLANT
NS IRRIG 1000ML POUR BTL (IV SOLUTION) ×4 IMPLANT
PACK C SECTION WH (CUSTOM PROCEDURE TRAY) ×2 IMPLANT
PAD ABD 7.5X8 STRL (GAUZE/BANDAGES/DRESSINGS) IMPLANT
RTRCTR C-SECT PINK 25CM LRG (MISCELLANEOUS) IMPLANT
SLEEVE SCD COMPRESS KNEE MED (MISCELLANEOUS) ×2 IMPLANT
SUT CHROMIC 1 CTX 36 (SUTURE) ×6 IMPLANT
SUT CHROMIC 2 0 CT 1 (SUTURE) IMPLANT
SUT PLAIN 2 0 (SUTURE)
SUT PLAIN 2 0 XLH (SUTURE) IMPLANT
SUT PLAIN ABS 2-0 54XMFL TIE (SUTURE) IMPLANT
SUT VIC AB 0 CT1 36 (SUTURE) IMPLANT
SUT VIC AB 4-0 KS 27 (SUTURE) IMPLANT
TOWEL OR 17X24 6PK STRL BLUE (TOWEL DISPOSABLE) ×4 IMPLANT
TRAY FOLEY CATH 14FR (SET/KITS/TRAYS/PACK) ×2 IMPLANT
VICRYL   259 ×2 IMPLANT
VICRYL 260 ×4 IMPLANT
VICRYL 426 PS-2 ×2 IMPLANT
WATER STERILE IRR 1000ML POUR (IV SOLUTION) IMPLANT

## 2010-10-28 NOTE — Progress Notes (Signed)
UR Chart review completed.  

## 2010-10-28 NOTE — Transfer of Care (Addendum)
Immediate Anesthesia Transfer of Care Note  Patient: Sara Coleman  Procedure(s) Performed:  CESAREAN SECTION - REPEAT INTRAUTERINE FETAL DEMISE  Patient Location: PACU  Anesthesia Type: Spinal  Level of Consciousness: awake, alert  and oriented  Airway & Oxygen Therapy: Patient Spontanous Breathing  Post-op Assessment: Report given to PACU RN. Patient to recover in room 172 with PACU RN. Baby in Moms arms. Dad and family present.  Post vital signs: stable  Complications: No apparent anesthesia complications

## 2010-10-28 NOTE — Anesthesia Postprocedure Evaluation (Signed)
  Anesthesia Post-op Note  Patient: Hospital doctor M Koning  Procedure(s) Performed:  CESAREAN SECTION - REPEAT INTRAUTERINE FETAL DEMISE  Patient Location: [facility] Unit  Anesthesia Type: Spinal  Level of Consciousness: awake, alert  and oriented  Airway and Oxygen Therapy: Patient Spontanous Breathing  Post-op Pain: none  Post-op Assessment: Post-op Vital signs reviewed, Patient's Cardiovascular Status Stable, Respiratory Function Stable, Patent Airway, No signs of Nausea or vomiting, Pain level controlled, No headache, No backache, No residual numbness and No residual motor weakness  Post-op Vital Signs: Reviewed and stable  Complications: No apparent anesthesia complications

## 2010-10-28 NOTE — Op Note (Signed)
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 Postop:  Same Procedure:  RLTCS Attending:  Angelise Petrich Asst:  OR staff EBL:  700 UOP:  100 IVF 1500 Comps:  None Path:  Placenta; portion of skin and achilles tendon to pathology for cytogenetic analysis Findings:  Non-viable infant female with apgars of 0, 0.  Weight not obtained.  Subjective polyhydramnios noted with approximately 1500 amniotic fluid.  + meconium noted.  Epidermis slipping from dermis.  Fetus with what appear to be low set ears, abnormally shaped skull, 2 vessel cord, penile anomaly (foreskin incompletely covers glans, though urethra appears in appropriate place).  All digits present.  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  Mother stable to pacu.

## 2010-10-28 NOTE — Anesthesia Preprocedure Evaluation (Signed)
Anesthesia Evaluation  Name, MR# and DOB Patient awake  General Assessment Comment  Reviewed: Allergy & Precautions, H&P , Patient's Chart, lab work & pertinent test results  Airway Mallampati: II TM Distance: >3 FB Neck ROM: full    Dental No notable dental hx.    Pulmonary  clear to auscultation  Pulmonary exam normal       Cardiovascular regular Normal    Neuro/Psych  Headaches, Negative Neurological ROS  Negative Psych ROS   GI/Hepatic negative GI ROS Neg liver ROS    Endo/Other  Negative Endocrine ROS  Renal/GU negative Renal ROS     Musculoskeletal   Abdominal   Peds  Hematology negative hematology ROS (+)   Anesthesia Other Findings   Reproductive/Obstetrics (+) Pregnancy                           Anesthesia Physical Anesthesia Plan  ASA: II and Emergent  Anesthesia Plan: Spinal   Post-op Pain Management:    Induction:   Airway Management Planned:   Additional Equipment:   Intra-op Plan:   Post-operative Plan:   Informed Consent: I have reviewed the patients History and Physical, chart, labs and discussed the procedure including the risks, benefits and alternatives for the proposed anesthesia with the patient or authorized representative who has indicated his/her understanding and acceptance.     Plan Discussed with:   Anesthesia Plan Comments:         Anesthesia Quick Evaluation

## 2010-10-28 NOTE — Plan of Care (Signed)
Problem: Consults Goal: Birthing Suites Patient Information Press F2 to bring up selections list Outcome: Progressing  IUFD (Intrauterine fetal demise)     

## 2010-10-28 NOTE — Progress Notes (Signed)
Spiritual Care - Referral from RN.  Provided grief support to patient and her family.  Baby still in room with family. Family are supportive and are helping make burial and service arrangements.  They requested information about burial assistance.  Gave them number for DSS burial assistance program and provided large burial cradle.  They plan to use [facility] in [place].  Gave Comfort Resource packet and reviewed briefly.  Dory Horn, Chaplain

## 2010-10-28 NOTE — Plan of Care (Signed)
Problem: Consults Goal: Birthing Suites Patient Information Press F2 to bring up selections list  Outcome: Completed/Met Date Met:  10/28/10  IUFD (Intrauterine fetal demise)

## 2010-10-28 NOTE — Addendum Note (Signed)
Addendum  created 10/28/10 1524 by Truitt Leep, CRNA   Modules edited:Anesthesia Medication Administration

## 2010-10-28 NOTE — Addendum Note (Signed)
Addendum  created 10/28/10 1524 by Zaynah Chawla, CRNA   Modules edited:Anesthesia Medication Administration    

## 2010-10-28 NOTE — Anesthesia Procedure Notes (Addendum)
Spinal Block  Patient location during procedure: OR Start time: 10/28/2010 6:31 AM Staffing Performed by: anesthesiologist  Preanesthetic Checklist Completed: patient identified, site marked, surgical consent, pre-op evaluation, timeout performed, IV checked, risks and benefits discussed and monitors and equipment checked Spinal Block Patient position: sitting Prep: DuraPrep Patient monitoring: heart rate, cardiac monitor, continuous pulse ox and blood pressure Approach: midline Location: L3-4 Injection technique: single-shot Needle Needle type: Sprotte  Needle gauge: 24 G Needle length: 9 cm Assessment Sensory level: T4 Additional Notes Patient identified.  Risk benefits discussed including failed block, incomplete pain control, headache, nerve damage, paralysis, blood pressure changes, nausea, vomiting, reactions to medication both toxic or allergic, and postpartum back pain.  Patient expressed understanding and wished to proceed.  All questions were answered.  Sterile technique used throughout procedure.  CSF was clear.  No parasthesia or other complications.  Please see nursing notes for vital signs.

## 2010-10-29 ENCOUNTER — Encounter (HOSPITAL_COMMUNITY): Payer: Self-pay | Admitting: Obstetrics and Gynecology

## 2010-10-29 LAB — CBC
Hemoglobin: 10.7 g/dL — ABNORMAL LOW (ref 12.0–15.0)
RBC: 3.68 MIL/uL — ABNORMAL LOW (ref 3.87–5.11)

## 2010-10-29 NOTE — Progress Notes (Signed)
Spiritual Care - Follow up visit with patient. She states family is still taking care of arrangements for the baby, and this is helpful to her.  She kept the baby with her for a long time yesterday, and it was hard for her to give him up.  Reminded her of grief support available after she goes home.  Dory Horn

## 2010-10-29 NOTE — Progress Notes (Signed)
Subjective: Postpartum Day 2: Cesarean Delivery Patient reports tolerating PO and no problems voiding.    Objective: Vital signs in last 24 hours: Temp:  [98 F (36.7 C)-99 F (37.2 C)] 98 F (36.7 C) (10/03 0557) Pulse Rate:  [67-90] 67  (10/03 0557) Resp:  [18] 18  (10/03 0557) BP: (98-114)/(32-72) 105/70 mmHg (10/03 0557) SpO2:  [98 %-100 %] 100 % (10/03 0557)  Physical Exam:  General: alert and no distress Lochia: appropriate Uterine Fundus: firm Incision: healing well DVT Evaluation: No evidence of DVT seen on physical exam.   Basename 10/29/10 0550 10/27/10 2022  HGB 10.7* 12.0  HCT 32.5* 36.3    Assessment/Plan: Status post Cesarean section. Doing well postoperatively.  Continue current care.  GREENE,ELEANOR E 10/29/2010, 4:06 PM

## 2010-10-30 NOTE — Progress Notes (Signed)
Subjective: Postpartum Day 3: Cesarean Delivery Patient reports tolerating PO.    Objective: Vital signs in last 24 hours: Temp:  [97.6 F (36.4 C)-99 F (37.2 C)] 97.6 F (36.4 C) (10/04 0552) Pulse Rate:  [75-89] 79  (10/04 0552) Resp:  [18-48] 18  (10/04 0552) BP: (98-104)/(62-70) 101/66 mmHg (10/04 0552) SpO2:  [98 %-99 %] 98 % (10/04 0552)  Physical Exam:  General: alert and no distress Lochia: appropriate Uterine Fundus: firm Incision: healing well DVT Evaluation: No evidence of DVT seen on physical exam.   Basename 10/29/10 0550 10/27/10 2022  HGB 10.7* 12.0  HCT 32.5* 36.3    Assessment/Plan: Status post Cesarean section. Doing well postoperatively.  Discharge home with standard precautions and return to clinic in 4-6 weeks.  GREENE,ELEANOR E 10/30/2010, 8:29 AM    Subjective: Postpartum Day 3: Cesarean Delivery Patient reports tolerating PO.    Objective: Vital signs in last 24 hours: Temp:  [97.6 F (36.4 C)-99 F (37.2 C)] 97.6 F (36.4 C) (10/04 0552) Pulse Rate:  [75-89] 79  (10/04 0552) Resp:  [18-48] 18  (10/04 0552) BP: (98-104)/(62-70) 101/66 mmHg (10/04 0552) SpO2:  [98 %-99 %] 98 % (10/04 0552)  Physical Exam:  General: cooperative Lochia: appropriate Uterine Fundus: firm Incision: healing well DVT Evaluation: No evidence of DVT seen on physical exam.   Basename 10/29/10 0550 10/27/10 2022  HGB 10.7* 12.0  HCT 32.5* 36.3    Assessment/Plan: Status post Cesarean section. Doing well postoperatively.  Discharge home with standard precautions and return to clinic in 4-6 weeks.  GREENE,ELEANOR E 10/30/2010, 8:29 AM

## 2010-10-30 NOTE — Discharge Summary (Signed)
Obstetric Discharge Summary Reason for Admission: term pregnancy, stillborn, congenital anomalies Prenatal Procedures: [place] Intrapartum Procedures: cesarean: low cervical, transverse Postpartum Procedures: none Complications-Operative and Postpartum: none  Hemoglobin  Date Value Range Status  10/29/2010 10.7* 12.0-15.0 (g/dL) Final     HCT  Date Value Range Status  10/29/2010 32.5* 36.0-46.0 (%) Final    Discharge Diagnoses: Term Pregnancy-delivered  Discharge Information: Date: 10/30/2010 Activity: pelvic rest Diet: routine Medications: PNV and Percocet Condition: stable Instructions: refer to practice specific booklet Discharge to: home   Newborn Data: Live born  Information for the patient's newborn:  Fujiko, Picazo [[id number]]  female ; [id number] , ; weight ;   Narda Rutherford, sent to [facility].  Jemarcus Dougal E 10/30/2010, 8:33 AM

## 2010-10-31 ENCOUNTER — Ambulatory Visit (HOSPITAL_COMMUNITY): Payer: Medicaid Other

## 2010-10-31 NOTE — Op Note (Signed)
NAME:  Sara Coleman, Sara Coleman                      ACCOUNT NO.:  MEDICAL RECORD NO.:  [id number]  LOCATION:                                 FACILITY:  PHYSICIAN:  Pricilla Holm, MD      DATE OF BIRTH:  1985-05-08  DATE OF PROCEDURE: DATE OF DISCHARGE:                              OPERATIVE REPORT   PREOPERATIVE DIAGNOSES:  History of cesarean section, intrauterine fetal demise, multiple fetal anomalies consistent with possible Trisomy 13, desires repeat cesarean section.  POSTOPERATIVE DIAGNOSES:  History of cesarean section, intrauterine fetal demise, multiple fetal anomalies consistent with possible Trisomy 64, desires repeat cesarean section.  PROCEDURE:  Repeat low transverse cesarean section.  ATTENDING:  Pricilla Holm, MD  ANESTHESIA:  Spinal.  ESTIMATED BLOOD LOSS:  600 mL.  COMPLICATIONS:  None.  Pathology was sent, portion of skin as well as Achilles tendon was excised from the lower extremity of fetus for cytogenetic analysis.  FINDINGS:  A nonviable infant female was delivered with Apgars of 0 at 1 minute and 0 at 5 minutes.  There was no heartbeat detected on ultrasound having been performed previously.  Weight was not obtained. There were multiple fetal anomalies including low-set ears, a malformed head and skull, penile anomalies and feet with rounded soles.  The placenta appeared grossly small and had a two-vessel cord.  The patient had normal tubes and ovaries bilaterally.  There was a mild-to-moderate amount of dense scar tissue of the anterior abdominal wall, yet the patient's abdomen was free of any adhesions.  PROCEDURE IN DETAIL:  The patient arrived in the maternal admissions unit with a complaint of no fetal movement for several days.  Ultrasound was performed by the PA which revealed no fetal cardiac activity.  A repeat ultrasound by the sonographer with the attending physician watching also revealed no fetal cardiac activity.  Thereafter, the findings were  discussed with the patient.  The patient had previously had extensive discussions with her attendings as an outpatient regarding possible VBAC versus repeat cesarean section.  The patient had decided previously to undergo a vaginal birth after cesarean.  On the night of her presentation, the patient and I discussed these 2 options again, these included the risks of failed VBAC, risks, although low, approximately 5% of uterine rupture upon induction of labor, possible need for further surgery including possible hysterectomy due to bleeding, need for transfusion, infection, damage to internal structures, etc.  The process of induction was also briefly described. The patient was informed that an induction could take up to sometimes greater than 24 hours.  After weighing her options, the patient opted for a repeat cesarean section.  After informed consent was obtained, the patient was taken to the operating room and placed in the dorsal lithotomy position with a leftward tilt.  The patient was then prepped and draped in routine sterile fashion.  A Foley catheter had been previously placed.  A Pfannenstiel skin incision was created per routine and carried down to the peritoneum.  The peritoneum was grasped with two hemostats and entered sharply with care to ensure that there were no underlying abdominal structures.  Thereafter, the peritoneal  defect was opened manually.  The bladder blade was placed.  The bladder flap was created per routine and a bladder blade was then replaced.  A hysterotomy incision was created with a knife and carried into the intrauterine cavity by blunt dissection.  The hysterotomy was then extended laterally and superiorly with manual extension.  A copious amounts of amniotic fluid was released consistent with polyhydramnios.  The fetal head was then brought to the hysterotomy and abdominal incisions.  However, despite fundal pressure, the fetal head could not be  delivered via the incision.  Therefore, using electrocautery, the patient's right rectus abdominis was incised in size approximately 2 cm.  This provided appropriate access to deliver the fetal head which was delivered with ease.  The remainder of the fetus was delivered and the cord was cut and clamped.  The fetus was handed over to the awaiting pediatric nursing team.  The uterus was cleared off the placenta, clots, and debris.  It was exteriorized.  The hysterotomy incision was then repaired in running locked fashion with #1 chromic.  Several additional figure-of-eight stitches were required to obtain excellent hemostasis.  The uterus was placed into its native position and the abdomen was copiously irrigated with warm normal saline.  The fascia was reapproximated in running fashion with #0 Vicryl after the peritoneum had been closed with 2-0 Vicryl.  The patient's skin and subcutaneous tissues were then irrigated.  The skin incision was reapproximated with a 4-0 Vicryl in subcuticular fashion.  The patient tolerated the procedure well.  All lap, needle, and instruments were counted correctly x2.  The patient was transferred to the recovery room in stable condition.          ______________________________ Pricilla Holm, MD     RB/MEDQ  D:  10/30/2010  T:  10/30/2010  Job:  [id number]

## 2010-11-11 NOTE — Progress Notes (Signed)
Noted.  Patient seen and examined.

## 2010-11-14 LAB — CHROMOSOME STD, POC(TISSUE)-NCBH

## 2011-12-31 ENCOUNTER — Other Ambulatory Visit: Payer: Self-pay

## 2012-01-25 ENCOUNTER — Other Ambulatory Visit (HOSPITAL_COMMUNITY): Payer: Self-pay | Admitting: Obstetrics & Gynecology

## 2012-01-25 DIAGNOSIS — O09299 Supervision of pregnancy with other poor reproductive or obstetric history, unspecified trimester: Secondary | ICD-10-CM

## 2012-02-04 ENCOUNTER — Ambulatory Visit (HOSPITAL_COMMUNITY): Payer: Medicaid Other | Attending: Obstetrics & Gynecology

## 2012-02-04 ENCOUNTER — Ambulatory Visit (HOSPITAL_COMMUNITY): Admission: RE | Admit: 2012-02-04 | Payer: Medicaid Other | Source: Ambulatory Visit

## 2012-02-04 ENCOUNTER — Ambulatory Visit (HOSPITAL_COMMUNITY): Payer: Medicaid Other

## 2012-02-16 ENCOUNTER — Other Ambulatory Visit (HOSPITAL_COMMUNITY): Payer: Self-pay | Admitting: Obstetrics & Gynecology

## 2012-02-16 DIAGNOSIS — Z3689 Encounter for other specified antenatal screening: Secondary | ICD-10-CM

## 2012-02-16 DIAGNOSIS — O4440 Low lying placenta NOS or without hemorrhage, unspecified trimester: Secondary | ICD-10-CM

## 2012-02-26 ENCOUNTER — Encounter (HOSPITAL_COMMUNITY): Payer: Self-pay

## 2012-02-26 ENCOUNTER — Ambulatory Visit (HOSPITAL_COMMUNITY): Admission: RE | Admit: 2012-02-26 | Payer: Medicaid Other | Source: Ambulatory Visit

## 2012-02-26 ENCOUNTER — Ambulatory Visit (HOSPITAL_COMMUNITY)
Admission: RE | Admit: 2012-02-26 | Discharge: 2012-02-26 | Disposition: A | Discharge status: Home / Self Care | Payer: Medicaid Other | Source: Ambulatory Visit | Attending: Obstetrics & Gynecology | Admitting: Obstetrics & Gynecology

## 2012-02-26 DIAGNOSIS — Z3689 Encounter for other specified antenatal screening: Secondary | ICD-10-CM

## 2012-02-26 DIAGNOSIS — O4440 Low lying placenta NOS or without hemorrhage, unspecified trimester: Secondary | ICD-10-CM

## 2012-02-26 DIAGNOSIS — O09299 Supervision of pregnancy with other poor reproductive or obstetric history, unspecified trimester: Secondary | ICD-10-CM | POA: Insufficient documentation

## 2012-02-26 DIAGNOSIS — O358XX0 Maternal care for other (suspected) fetal abnormality and damage, not applicable or unspecified: Secondary | ICD-10-CM | POA: Insufficient documentation

## 2012-02-26 DIAGNOSIS — Z1389 Encounter for screening for other disorder: Secondary | ICD-10-CM | POA: Insufficient documentation

## 2012-02-26 DIAGNOSIS — Z363 Encounter for antenatal screening for malformations: Secondary | ICD-10-CM | POA: Insufficient documentation

## 2012-02-26 NOTE — Progress Notes (Signed)
Genetic Counseling  High-Risk Gestation Note  Appointment Date:  02/26/2012 Referred By: Delbert Harness, MD Date of Birth:  05-Aug-1985    Pregnancy History: [id number] Estimated Date of Delivery: 05/29/12 Estimated Gestational Age: [redacted]w[redacted]d Attending: Particia Nearing, MD   Sara Coleman was seen for genetic counseling regarding a previous pregnancy with Trisomy 18.   Both family histories were reviewed and found to be contributory for trisomy 94 in the patient's previous pregnancy. The patient's previous pregnancy resulted in stillbirth in 2012 and was confirmed to have Trisomy 18 via fluorescent in situ hybridization studies (FISH) on products of conception. Full karyotype analysis was not able to be performed given that metaphase chromosomes were not identified in sample. Sara Coleman [facility]. The family history is otherwise unremarkable for updates regarding birth defects, mental retardation, recurrent pregnancy loss, or known genetic conditions. Without further information regarding the provided family history, an accurate genetic risk cannot be calculated. Further genetic counseling is warranted if more information is obtained.  She was counseled regarding maternal age and the association with risk for chromosome conditions due to nondisjunction with aging of the ova.   We reviewed chromosomes and nondisjunction. We discussed that trisomy 38 represents the second most common autosomal trisomy after Down syndrome and occurs in 1 in 3600-8500 livebirths. The prevalence is estimated to be much higher when pregnancy terminations, stillbirths, and miscarriages are included. Sara Coleman was counseled that trisomy 88 results from meiotic nondisjunction involving the 18th pair of chromosomes in 94% of cases. Partial trisomy 18 occurs from an unbalanced translocation and represents <2% of cases; and mosaic trisomy 18 occurs in  approximately 4-5% of cases. We discussed that the trisomy 9 in Sara Coleman' previous pregnancy was most likely due to nondisjunction. However, given that karyotype analysis was not able to be performed, the presence of partial trisomy 18 due to translocation cannot be ruled out. We discussed that recurrence risk for current and future pregnancies for a fetal trisomy would depend upon the specific mechanism by which trisomy 18 occurred in the previous pregnancy. We offered peripheral blood chromosome analysis to Ms. Bernath to assess for an underlying chromosome rearrangement for herself. She declined chromosome analysis at this time.   In the case of trisomy 26 due to nondisjunction, we discussed that Nash Shearer al (2004) found an increased chance for trisomy 18 of approximately 2.5 times the age related risk and recurrence risk for any viable fetal trisomy of approximately 1.6 times the a priori age related risk. Thus, we discussed that recurrence risk for trisomy 18 in the current pregnancy, prior to screening, is approximately 1 in 1,734, and recurrence risk for any fetal trisomy in the current pregnancy, prior to screening, is approximately 1 in 297. This risk assessment will change with time with future pregnancies, given that the a priori age-related risk changes over time. These recurrence risk estimates would not apply in the case that the previous trisomy 18 was due to an unbalanced translocation.   We also reviewed Sara Coleman' maternal serum Quad screen result and the associated reduction in risks for fetal Down syndrome (1 in 11,400), trisomy 18 (1 in 6,280), and ONTDs (less than 1 in 54,600).  She understands that Quad screening provides a pregnancy specific risk for these conditions, but is not considered to be diagnostic.    We reviewed other available screening options including noninvasive prenatal testing (NIPT) and detailed  ultrasound.  Specifically, we discussed that NIPT analyzes cell free  fetal DNA found in the maternal circulation. This test is not diagnostic for chromosome conditions, but can provide information regarding the presence or absence of extra fetal DNA for chromosomes 13, 18, 21, X, and Y, and missing fetal DNA for chromosome X and Y (Turner syndrome). Thus, it would not identify or rule out all genetic conditions. The reported detection rate is greater than 99% for Trisomy 21, greater than 98% for Trisomy 18, and is approximately 80% (8 out of 10) for Trisomy 13. The false positive rate is reported to be less than 0.1% for any of these conditions.  In addition, we discussed that ~50-80% of fetuses with Down syndrome and up to 90-95% of fetuses with trisomy 18/13, when well visualized, have detectable anomalies or soft markers by detailed ultrasound (~18+ weeks gestation).   Sara Coleman was also counseled regarding diagnostic testing via amniocentesis.  We reviewed the approximate 1 in 300-500 risk for complications, including spontaneous preterm labor and delivery. After consideration of all the options, she elected to proceed with targeted ultrasound only and declined NIPT and amniocentesis.  A complete ultrasound was performed today.  The ultrasound report will be sent under separate cover.    Sara Coleman was provided with written information regarding sickle cell anemia (SCA) including the carrier frequency and incidence in the African-American population, the availability of carrier testing and prenatal diagnosis if indicated.  In addition, we discussed that hemoglobinopathies are routinely screened for as part of the [place] newborn screening panel.  She declined hemoglobin electrophoresis today.   Sara Coleman denied exposure to environmental toxins or chemical agents. She denied the use of alcohol, tobacco or street drugs. She denied significant viral illnesses during the course of her pregnancy. Her medical and surgical histories were noncontributory.    I counseled Ms. Ankita M  Coleman regarding the above risks and available options.  The approximate face-to-face time with the genetic counselor was 30 minutes.  Quinn Plowman, MS Certified Genetic Counselor 02/26/2012

## 2012-05-24 ENCOUNTER — Encounter (HOSPITAL_COMMUNITY): Payer: Self-pay | Admitting: Anesthesiology

## 2012-05-24 ENCOUNTER — Other Ambulatory Visit: Payer: Self-pay | Admitting: Obstetrics and Gynecology

## 2012-05-24 ENCOUNTER — Encounter (HOSPITAL_COMMUNITY)
Admission: RE | Disposition: A | Discharge status: Home / Self Care | Payer: Self-pay | Source: Ambulatory Visit | Attending: Obstetrics and Gynecology

## 2012-05-24 ENCOUNTER — Encounter (HOSPITAL_COMMUNITY): Payer: Self-pay | Admitting: *Deleted

## 2012-05-24 ENCOUNTER — Inpatient Hospital Stay (HOSPITAL_COMMUNITY)
Admission: RE | Admit: 2012-05-24 | Discharge: 2012-05-27 | DRG: 766 | Disposition: A | Discharge status: Home / Self Care | Payer: Medicaid Other | Source: Ambulatory Visit | Attending: Obstetrics and Gynecology | Admitting: Obstetrics and Gynecology

## 2012-05-24 ENCOUNTER — Inpatient Hospital Stay (HOSPITAL_COMMUNITY): Payer: Medicaid Other | Admitting: Anesthesiology

## 2012-05-24 DIAGNOSIS — D649 Anemia, unspecified: Secondary | ICD-10-CM | POA: Diagnosis not present

## 2012-05-24 DIAGNOSIS — O9903 Anemia complicating the puerperium: Secondary | ICD-10-CM | POA: Diagnosis not present

## 2012-05-24 DIAGNOSIS — O09293 Supervision of pregnancy with other poor reproductive or obstetric history, third trimester: Secondary | ICD-10-CM

## 2012-05-24 DIAGNOSIS — O34219 Maternal care for unspecified type scar from previous cesarean delivery: Principal | ICD-10-CM

## 2012-05-24 LAB — CBC
MCH: 28.1 pg (ref 26.0–34.0)
MCV: 87.5 fL (ref 78.0–100.0)
Platelets: 180 10*3/uL (ref 150–400)
RBC: 4.23 MIL/uL (ref 3.87–5.11)
RDW: 14.5 % (ref 11.5–15.5)
WBC: 7.3 10*3/uL (ref 4.0–10.5)

## 2012-05-24 LAB — RPR: RPR Ser Ql: NONREACTIVE

## 2012-05-24 SURGERY — Surgical Case
Anesthesia: Spinal | Site: Abdomen | Wound class: Clean Contaminated

## 2012-05-24 MED ORDER — ZOLPIDEM TARTRATE 5 MG PO TABS: 5.0000 mg | ORAL_TABLET | Freq: Every evening | ORAL | Status: DC | PRN

## 2012-05-24 MED ORDER — ONDANSETRON HCL 4 MG/2ML IJ SOLN
INTRAMUSCULAR | Status: DC | PRN
  Administered 2012-05-24: 4 mg via INTRAVENOUS

## 2012-05-24 MED ORDER — DIPHENHYDRAMINE HCL 25 MG PO CAPS: 25.0000 mg | ORAL_CAPSULE | Freq: Four times a day (QID) | ORAL | Status: DC | PRN

## 2012-05-24 MED ORDER — SCOPOLAMINE 1 MG/3DAYS TD PT72
MEDICATED_PATCH | TRANSDERMAL | Status: AC
  Administered 2012-05-24: 1.5 mg via TRANSDERMAL
  Filled 2012-05-24: qty 1

## 2012-05-24 MED ORDER — SIMETHICONE 80 MG PO CHEW
80.0000 mg | CHEWABLE_TABLET | ORAL | Status: DC | PRN
  Administered 2012-05-25: 80 mg via ORAL

## 2012-05-24 MED ORDER — SCOPOLAMINE 1 MG/3DAYS TD PT72
1.0000 | MEDICATED_PATCH | Freq: Once | TRANSDERMAL | Status: DC
  Administered 2012-05-24: 1.5 mg via TRANSDERMAL

## 2012-05-24 MED ORDER — PROMETHAZINE HCL 25 MG/ML IJ SOLN: 6.2500 mg | INTRAMUSCULAR | Status: DC | PRN

## 2012-05-24 MED ORDER — OXYTOCIN 10 UNIT/ML IJ SOLN
40.0000 [IU] | INTRAVENOUS | Status: DC | PRN
  Administered 2012-05-24: 40 [IU] via INTRAVENOUS

## 2012-05-24 MED ORDER — NALOXONE HCL 1 MG/ML IJ SOLN
1.0000 ug/kg/h | INTRAVENOUS | Status: DC | PRN
  Filled 2012-05-24: qty 2

## 2012-05-24 MED ORDER — ONDANSETRON HCL 4 MG/2ML IJ SOLN: 4.0000 mg | Freq: Three times a day (TID) | INTRAMUSCULAR | Status: DC | PRN

## 2012-05-24 MED ORDER — NALBUPHINE HCL 10 MG/ML IJ SOLN
5.0000 mg | INTRAMUSCULAR | Status: DC | PRN
  Filled 2012-05-24: qty 1

## 2012-05-24 MED ORDER — NALOXONE HCL 0.4 MG/ML IJ SOLN: 0.4000 mg | INTRAMUSCULAR | Status: DC | PRN

## 2012-05-24 MED ORDER — SENNOSIDES-DOCUSATE SODIUM 8.6-50 MG PO TABS
2.0000 | ORAL_TABLET | Freq: Every day | ORAL | Status: DC
  Administered 2012-05-24 – 2012-05-26 (×3): 2 via ORAL

## 2012-05-24 MED ORDER — OXYTOCIN 40 UNITS IN LACTATED RINGERS INFUSION - SIMPLE MED: 62.5000 mL/h | INTRAVENOUS | Status: AC

## 2012-05-24 MED ORDER — PHENYLEPHRINE 40 MCG/ML (10ML) SYRINGE FOR IV PUSH (FOR BLOOD PRESSURE SUPPORT)
PREFILLED_SYRINGE | INTRAVENOUS | Status: AC
  Filled 2012-05-24: qty 5

## 2012-05-24 MED ORDER — KETOROLAC TROMETHAMINE 30 MG/ML IJ SOLN
30.0000 mg | Freq: Four times a day (QID) | INTRAMUSCULAR | Status: AC | PRN
  Administered 2012-05-24: 30 mg via INTRAVENOUS

## 2012-05-24 MED ORDER — EPHEDRINE SULFATE 50 MG/ML IJ SOLN
INTRAMUSCULAR | Status: DC | PRN
  Administered 2012-05-24: 10 mg via INTRAVENOUS

## 2012-05-24 MED ORDER — OXYCODONE-ACETAMINOPHEN 5-325 MG PO TABS
1.0000 | ORAL_TABLET | ORAL | Status: DC | PRN
  Administered 2012-05-25 – 2012-05-27 (×7): 2 via ORAL
  Filled 2012-05-24 (×3): qty 2
  Filled 2012-05-24: qty 1
  Filled 2012-05-24 (×3): qty 2

## 2012-05-24 MED ORDER — MORPHINE SULFATE 0.5 MG/ML IJ SOLN
INTRAMUSCULAR | Status: AC
  Filled 2012-05-24: qty 10

## 2012-05-24 MED ORDER — ACETAMINOPHEN 10 MG/ML IV SOLN
1000.0000 mg | Freq: Four times a day (QID) | INTRAVENOUS | Status: AC | PRN
  Filled 2012-05-24: qty 100

## 2012-05-24 MED ORDER — DIPHENHYDRAMINE HCL 50 MG/ML IJ SOLN: 12.5000 mg | INTRAMUSCULAR | Status: DC | PRN

## 2012-05-24 MED ORDER — TETANUS-DIPHTH-ACELL PERTUSSIS 5-2.5-18.5 LF-MCG/0.5 IM SUSP: 0.5000 mL | Freq: Once | INTRAMUSCULAR | Status: DC

## 2012-05-24 MED ORDER — FENTANYL CITRATE 0.05 MG/ML IJ SOLN
INTRAMUSCULAR | Status: DC | PRN
  Administered 2012-05-24: 25 ug via INTRATHECAL

## 2012-05-24 MED ORDER — MEASLES, MUMPS & RUBELLA VAC ~~LOC~~ INJ: 0.5000 mL | INJECTION | Freq: Once | SUBCUTANEOUS | Status: DC

## 2012-05-24 MED ORDER — LACTATED RINGERS IV SOLN
INTRAVENOUS | Status: DC | PRN
  Administered 2012-05-24: 14:00:00 via INTRAVENOUS

## 2012-05-24 MED ORDER — KETOROLAC TROMETHAMINE 30 MG/ML IJ SOLN
INTRAMUSCULAR | Status: AC
  Filled 2012-05-24: qty 1

## 2012-05-24 MED ORDER — DIBUCAINE 1 % RE OINT: 1.0000 "application " | TOPICAL_OINTMENT | RECTAL | Status: DC | PRN

## 2012-05-24 MED ORDER — SIMETHICONE 80 MG PO CHEW
80.0000 mg | CHEWABLE_TABLET | Freq: Three times a day (TID) | ORAL | Status: DC
  Administered 2012-05-24 – 2012-05-27 (×8): 80 mg via ORAL

## 2012-05-24 MED ORDER — FENTANYL CITRATE 0.05 MG/ML IJ SOLN: 25.0000 ug | INTRAMUSCULAR | Status: DC | PRN

## 2012-05-24 MED ORDER — METOCLOPRAMIDE HCL 5 MG/ML IJ SOLN: 10.0000 mg | Freq: Three times a day (TID) | INTRAMUSCULAR | Status: DC | PRN

## 2012-05-24 MED ORDER — ONDANSETRON HCL 4 MG/2ML IJ SOLN
INTRAMUSCULAR | Status: AC
  Filled 2012-05-24: qty 2

## 2012-05-24 MED ORDER — FENTANYL CITRATE 0.05 MG/ML IJ SOLN
INTRAMUSCULAR | Status: AC
  Filled 2012-05-24: qty 2

## 2012-05-24 MED ORDER — PHENYLEPHRINE HCL 10 MG/ML IJ SOLN
INTRAMUSCULAR | Status: DC | PRN
  Administered 2012-05-24 (×6): 80 ug via INTRAVENOUS

## 2012-05-24 MED ORDER — KETOROLAC TROMETHAMINE 30 MG/ML IJ SOLN: 30.0000 mg | Freq: Four times a day (QID) | INTRAMUSCULAR | Status: AC | PRN

## 2012-05-24 MED ORDER — LANOLIN HYDROUS EX OINT: 1.0000 "application " | TOPICAL_OINTMENT | CUTANEOUS | Status: DC | PRN

## 2012-05-24 MED ORDER — EPHEDRINE 5 MG/ML INJ
INTRAVENOUS | Status: AC
  Filled 2012-05-24: qty 10

## 2012-05-24 MED ORDER — LACTATED RINGERS IV SOLN
INTRAVENOUS | Status: DC
  Administered 2012-05-24: 21:00:00 via INTRAVENOUS

## 2012-05-24 MED ORDER — BUPIVACAINE ON-Q PAIN PUMP (FOR ORDER SET NO CHG): INJECTION | Status: DC

## 2012-05-24 MED ORDER — WITCH HAZEL-GLYCERIN EX PADS: 1.0000 "application " | MEDICATED_PAD | CUTANEOUS | Status: DC | PRN

## 2012-05-24 MED ORDER — ONDANSETRON HCL 4 MG/2ML IJ SOLN: 4.0000 mg | INTRAMUSCULAR | Status: DC | PRN

## 2012-05-24 MED ORDER — DIPHENHYDRAMINE HCL 50 MG/ML IJ SOLN: 25.0000 mg | INTRAMUSCULAR | Status: DC | PRN

## 2012-05-24 MED ORDER — PRENATAL MULTIVITAMIN CH
1.0000 | ORAL_TABLET | Freq: Every day | ORAL | Status: DC
  Administered 2012-05-25 – 2012-05-27 (×3): 1 via ORAL
  Filled 2012-05-24 (×2): qty 1

## 2012-05-24 MED ORDER — MORPHINE SULFATE (PF) 0.5 MG/ML IJ SOLN
INTRAMUSCULAR | Status: DC | PRN
  Administered 2012-05-24: .15 mg via INTRATHECAL

## 2012-05-24 MED ORDER — NALBUPHINE HCL 10 MG/ML IJ SOLN
5.0000 mg | INTRAMUSCULAR | Status: DC | PRN
  Administered 2012-05-24 – 2012-05-25 (×2): 10 mg via INTRAVENOUS
  Filled 2012-05-24 (×3): qty 1

## 2012-05-24 MED ORDER — MEPERIDINE HCL 25 MG/ML IJ SOLN: 6.2500 mg | INTRAMUSCULAR | Status: DC | PRN

## 2012-05-24 MED ORDER — ONDANSETRON HCL 4 MG PO TABS
4.0000 mg | ORAL_TABLET | ORAL | Status: DC | PRN
  Administered 2012-05-27: 4 mg via ORAL
  Filled 2012-05-24: qty 1

## 2012-05-24 MED ORDER — DIPHENHYDRAMINE HCL 25 MG PO CAPS: 25.0000 mg | ORAL_CAPSULE | ORAL | Status: DC | PRN

## 2012-05-24 MED ORDER — BUPIVACAINE IN DEXTROSE 0.75-8.25 % IT SOLN
INTRATHECAL | Status: DC | PRN
  Administered 2012-05-24: 1.6 mL via INTRATHECAL

## 2012-05-24 MED ORDER — CEFAZOLIN SODIUM-DEXTROSE 2-3 GM-% IV SOLR
INTRAVENOUS | Status: AC
  Filled 2012-05-24: qty 50

## 2012-05-24 MED ORDER — OXYTOCIN 10 UNIT/ML IJ SOLN
INTRAMUSCULAR | Status: AC
  Filled 2012-05-24: qty 4

## 2012-05-24 MED ORDER — MEDROXYPROGESTERONE ACETATE 150 MG/ML IM SUSP: 150.0000 mg | INTRAMUSCULAR | Status: DC | PRN

## 2012-05-24 MED ORDER — CEFAZOLIN SODIUM-DEXTROSE 2-3 GM-% IV SOLR
2.0000 g | INTRAVENOUS | Status: AC
  Administered 2012-05-24: 2 g via INTRAVENOUS

## 2012-05-24 MED ORDER — MENTHOL 3 MG MT LOZG: 1.0000 | LOZENGE | OROMUCOSAL | Status: DC | PRN

## 2012-05-24 MED ORDER — MIDAZOLAM HCL 2 MG/2ML IJ SOLN: 0.5000 mg | Freq: Once | INTRAMUSCULAR | Status: DC | PRN

## 2012-05-24 MED ORDER — SODIUM CHLORIDE 0.9 % IJ SOLN
3.0000 mL | INTRAMUSCULAR | Status: DC | PRN
  Administered 2012-05-25: 3 mL via INTRAVENOUS

## 2012-05-24 MED ORDER — LACTATED RINGERS IV SOLN
Freq: Once | INTRAVENOUS | Status: AC
Start: 2012-05-24 — End: 2012-05-24
  Administered 2012-05-24 (×3): via INTRAVENOUS

## 2012-05-24 MED ORDER — IBUPROFEN 600 MG PO TABS
600.0000 mg | ORAL_TABLET | Freq: Four times a day (QID) | ORAL | Status: DC
  Administered 2012-05-25 – 2012-05-27 (×10): 600 mg via ORAL
  Filled 2012-05-24 (×10): qty 1

## 2012-05-24 MED ORDER — SCOPOLAMINE 1 MG/3DAYS TD PT72
1.0000 | MEDICATED_PATCH | Freq: Once | TRANSDERMAL | Status: DC
  Filled 2012-05-24: qty 1

## 2012-05-24 SURGICAL SUPPLY — 37 items
BENZOIN TINCTURE PRP APPL 2/3 (GAUZE/BANDAGES/DRESSINGS) ×2 IMPLANT
CLOTH BEACON ORANGE TIMEOUT ST (SAFETY) ×2 IMPLANT
DRAPE LG THREE QUARTER DISP (DRAPES) ×2 IMPLANT
DRESSING TELFA 8X3 (GAUZE/BANDAGES/DRESSINGS) ×2 IMPLANT
DRSG OPSITE POSTOP 4X10 (GAUZE/BANDAGES/DRESSINGS) ×2 IMPLANT
DURAPREP 26ML APPLICATOR (WOUND CARE) ×2 IMPLANT
ELECT REM PT RETURN 9FT ADLT (ELECTROSURGICAL) ×2
ELECTRODE REM PT RTRN 9FT ADLT (ELECTROSURGICAL) ×1 IMPLANT
GAUZE SPONGE 4X4 12PLY STRL LF (GAUZE/BANDAGES/DRESSINGS) ×2 IMPLANT
GLOVE BIO SURGEON STRL SZ 6.5 (GLOVE) ×2 IMPLANT
GLOVE BIO SURGEON STRL SZ8 (GLOVE) ×2 IMPLANT
GLOVE BIOGEL PI IND STRL 6.5 (GLOVE) ×1 IMPLANT
GLOVE BIOGEL PI IND STRL 7.0 (GLOVE) ×2 IMPLANT
GLOVE BIOGEL PI IND STRL 8 (GLOVE) ×1 IMPLANT
GLOVE BIOGEL PI INDICATOR 6.5 (GLOVE) ×1
GLOVE BIOGEL PI INDICATOR 7.0 (GLOVE) ×2
GLOVE BIOGEL PI INDICATOR 8 (GLOVE) ×1
GLOVE SURG SS PI 6.5 STRL IVOR (GLOVE) ×2 IMPLANT
GLOVE SURG SS PI 7.0 STRL IVOR (GLOVE) ×2 IMPLANT
GOWN PREVENTION PLUS XXLARGE (GOWN DISPOSABLE) ×4 IMPLANT
GOWN STRL REIN 3XL LVL4 (GOWN DISPOSABLE) ×2 IMPLANT
GOWN STRL REIN XL XLG (GOWN DISPOSABLE) ×4 IMPLANT
NEEDLE HYPO 25X5/8 SAFETYGLIDE (NEEDLE) ×2 IMPLANT
NS IRRIG 1000ML POUR BTL (IV SOLUTION) ×4 IMPLANT
PACK C SECTION WH (CUSTOM PROCEDURE TRAY) ×2 IMPLANT
PAD ABD 7.5X8 STRL (GAUZE/BANDAGES/DRESSINGS) ×2 IMPLANT
PAD OB MATERNITY 4.3X12.25 (PERSONAL CARE ITEMS) ×2 IMPLANT
STRIP CLOSURE SKIN 1/2X4 (GAUZE/BANDAGES/DRESSINGS) ×2 IMPLANT
SUT CHROMIC 2 0 CT 1 (SUTURE) ×4 IMPLANT
SUT PLAIN 2 0 (SUTURE) ×1
SUT PLAIN 2 0 XLH (SUTURE) ×2 IMPLANT
SUT PLAIN ABS 2-0 CT1 27XMFL (SUTURE) ×1 IMPLANT
SUT VIC AB 0 CT1 36 (SUTURE) ×8 IMPLANT
SUT VIC AB 4-0 KS 27 (SUTURE) ×2 IMPLANT
TAPE CLOTH SURG 4X10 WHT LF (GAUZE/BANDAGES/DRESSINGS) ×2 IMPLANT
TOWEL OR 17X24 6PK STRL BLUE (TOWEL DISPOSABLE) ×8 IMPLANT
TRAY FOLEY CATH 14FR (SET/KITS/TRAYS/PACK) ×2 IMPLANT

## 2012-05-24 NOTE — Anesthesia Procedure Notes (Signed)
Spinal  Patient location during procedure: OR Start time: 05/24/2012 1:51 PM Staffing Anesthesiologist: Angus Seller., Harrell Gave. Performed by: anesthesiologist  Preanesthetic Checklist Completed: patient identified, site marked, surgical consent, pre-op evaluation, timeout performed, IV checked, risks and benefits discussed and monitors and equipment checked Spinal Block Patient position: sitting Prep: DuraPrep Patient monitoring: heart rate, cardiac monitor, continuous pulse ox and blood pressure Approach: midline Location: L3-4 Injection technique: single-shot Needle Needle type: Sprotte  Needle gauge: 24 G Needle length: 9 cm Assessment Sensory level: T4 Additional Notes Patient identified.  Risk benefits discussed including failed block, incomplete pain control, headache, nerve damage, paralysis, blood pressure changes, nausea, vomiting, reactions to medication both toxic or allergic, and postpartum back pain.  Patient expressed understanding and wished to proceed.  All questions were answered.  Sterile technique used throughout procedure.  CSF was clear.  No parasthesia or other complications.  Please see nursing notes for vital signs.

## 2012-05-24 NOTE — H&P (Signed)
H & P updated. No change.  

## 2012-05-24 NOTE — Preoperative (Signed)
Beta Blockers   Reason not to administer Beta Blockers:Not Applicable 

## 2012-05-24 NOTE — Op Note (Signed)
Cesarean Section Procedure Note   Sara Coleman   05/24/2012  Indications: prior CS   Pre-operative Diagnosis: previous cesarean section, anemia.   Post-operative Diagnosis: Same   Surgeon: Fortino Sic  Assistants: Dr. Christell Constant  Anesthesia: spinal  Procedure Details:  The patient was seen in the Holding Room. The risks, benefits, complications, treatment options, and expected outcomes were discussed with the patient. The patient concurred with the proposed plan, giving informed consent. The patient was identified as [employer] and the procedure verified as C-Section Delivery. A Time Out was held and the above information confirmed.  After induction of anesthesia, the patient was draped and prepped in the usual sterile manner. A transverse incision was made and carried down through the subcutaneous tissue to the fascia. The fascial incision was made and extended transversely. The fascia was separated from the underlying rectus tissue superiorly and inferiorly. The peritoneum was identified and entered. The peritoneal incision was extended longitudinally. The utero-vesical peritoneal reflection was incised transversely and the bladder flap was bluntly freed from the lower uterine segment. A low transverse uterine incision was made. Delivered from cephalic presentation was a liiving newborn femaleinfan with Apgar scores of 9 at one minute and 9 at five minutes. A cord ph was not sent. The umbilical cord was clamped and cut cord. A sample was obtained for evaluation. The placenta was removed Intact and appeared normal.  The uterine incision was closed with running locked sutures of 1-0 Monocryl. A second imbricating layer of the same suture was placed.  Hemostasis was observed. The paracolic gutters were irrigated. The fascia was then reapproximated with running sutures of 1-0Vicryl. The subcuticular closure was performed using 3-0 Monocryl.  Instrument, sponge, and needle counts were correct  prior the abdominal closure and were correct at the conclusion of the case.    Findings: Viable female infant   Estimated Blood Loss: * No blood loss amount entered *   Total IV Fluids:   Urine Output: see anesthesia record  Specimens: placenta   Complications: no complications  Disposition: PACU - hemodynamically stable.  Maternal Condition: stable   Baby condition / location:  nursery-stable    Signed: Surgeon(s): Fortino Sic, MD Delbert Harness, MD

## 2012-05-24 NOTE — Anesthesia Postprocedure Evaluation (Signed)
  Anesthesia Post Note  Patient: Sara Coleman  Procedure(s) Performed: Procedure(s) (LRB): CESAREAN SECTION (N/A)  Anesthesia type: Spinal  Patient location: PACU  Post pain: Pain level controlled  Post assessment: Post-op Vital signs reviewed  Last Vitals:  Filed Vitals:   05/24/12 1244  BP: 106/66  Pulse: 73  Temp: 37 C  Resp: 18    Post vital signs: Reviewed  Level of consciousness: awake  Complications: No apparent anesthesia complications

## 2012-05-24 NOTE — H&P (Signed)
Raynie CERIAH KOHLER is a 27 y.o. female presenting for repeat Cesarean Section.  LMP was 08/22/12, EDC 5/4 by 18 week [place]. Prenatal course was uncomplicated.  The patient has had 2 prior CS.  The last pregnancy was delivered via CS and was stillborn at term due to trisomy 66.    OB History   Grav Para Term Preterm Abortions TAB SAB Ect Mult Living   4 2 2  0 1 1 0 0 0 1     Past Medical History  Diagnosis Date  . Migraine    Past Surgical History  Procedure Laterality Date  . Cesarean section      for breech  . Cesarean section  10/28/2010    Procedure: CESAREAN SECTION;  Surgeon: Fortino Sic, MD;  Location: [facility];  Service: Gynecology;  Laterality: N/A;  REPEAT INTRAUTERINE FETAL DEMISE   Family History: family history is not on file. Social History:  reports that she has never smoked. She does not have any smokeless tobacco history on file. She reports that she does not drink alcohol or use illicit drugs.   ROS   Noncontributory     Last menstrual period 08/23/2011.  Physical exam:  VSS, afebrile HEENT nl Chest clear Heart S1 and S2 clear Abd BS present Term gravida Ext nl   Prenatal labs: ABO, Rh:   Antibody:   Rubella:   RPR:    HBsAg:    HIV:    GBS:     Assessment/Plan: 39 plus weeks,  Prior CS x 2 Repeat CS.   GREENE,ELEANOR E 05/24/2012, 7:56 AM

## 2012-05-24 NOTE — Anesthesia Preprocedure Evaluation (Signed)
Anesthesia Evaluation  Patient identified by MRN, date of birth, ID band Patient awake    Reviewed: Allergy & Precautions, H&P , NPO status , Patient's Chart, lab work & pertinent test results  Airway Mallampati: II TM Distance: >3 FB Neck ROM: full    Dental no notable dental hx.    Pulmonary neg pulmonary ROS,  breath sounds clear to auscultation  Pulmonary exam normal       Cardiovascular Exercise Tolerance: Good negative cardio ROS  Rhythm:regular Rate:Normal     Neuro/Psych  Headaches, negative neurological ROS  negative psych ROS   GI/Hepatic negative GI ROS, Neg liver ROS,   Endo/Other  negative endocrine ROS  Renal/GU negative Renal ROS  negative genitourinary   Musculoskeletal   Abdominal Normal abdominal exam  (+)   Peds  Hematology negative hematology ROS (+)   Anesthesia Other Findings   Reproductive/Obstetrics (+) Pregnancy                           Anesthesia Physical Anesthesia Plan  ASA: II  Anesthesia Plan: Spinal   Post-op Pain Management:    Induction:   Airway Management Planned:   Additional Equipment:   Intra-op Plan:   Post-operative Plan:   Informed Consent: I have reviewed the patients History and Physical, chart, labs and discussed the procedure including the risks, benefits and alternatives for the proposed anesthesia with the patient or authorized representative who has indicated his/her understanding and acceptance.     Plan Discussed with: Anesthesiologist, CRNA and Surgeon  Anesthesia Plan Comments:         Anesthesia Quick Evaluation

## 2012-05-24 NOTE — Transfer of Care (Signed)
Immediate Anesthesia Transfer of Care Note  Patient: Sara Coleman  Procedure(s) Performed: Procedure(s): CESAREAN SECTION (N/A)  Patient Location: PACU  Anesthesia Type:Spinal  Level of Consciousness: awake, alert , oriented and patient cooperative  Airway & Oxygen Therapy: Patient Spontanous Breathing  Post-op Assessment: Report given to PACU RN and Post -op Vital signs reviewed and stable  Post vital signs: Reviewed and stable  Complications: No apparent anesthesia complications

## 2012-05-25 ENCOUNTER — Encounter (HOSPITAL_COMMUNITY): Payer: Self-pay | Admitting: Obstetrics and Gynecology

## 2012-05-25 LAB — CBC
HCT: 31.9 % — ABNORMAL LOW (ref 36.0–46.0)
Hemoglobin: 10.6 g/dL — ABNORMAL LOW (ref 12.0–15.0)
MCHC: 33.2 g/dL (ref 30.0–36.0)
MCV: 86.7 fL (ref 78.0–100.0)

## 2012-05-25 MED ORDER — FERROUS SULFATE 325 (65 FE) MG PO TABS
325.0000 mg | ORAL_TABLET | Freq: Three times a day (TID) | ORAL | Status: DC
  Administered 2012-05-26 – 2012-05-27 (×4): 325 mg via ORAL
  Filled 2012-05-25 (×4): qty 1

## 2012-05-25 NOTE — Progress Notes (Signed)
Subjective: Postpartum Day 1: Cesarean Delivery Patient reports tolerating PO and no problems voiding.    Objective: Vital signs in last 24 hours: Temp:  [98.2 F (36.8 C)-98.6 F (37 C)] 98.4 F (36.9 C) (04/30 1400) Pulse Rate:  [54-68] 63 (04/30 1400) Resp:  [16-20] 20 (04/30 1400) BP: (104-123)/(62-69) 104/65 mmHg (04/30 1400) SpO2:  [97 %-100 %] 98 % (04/30 1015)  Physical Exam:  General: alert Lochia: appropriate Uterine Fundus: firm Incision: no significant drainage DVT Evaluation: No evidence of DVT seen on physical exam.   Recent Labs  05/24/12 1220 05/25/12 0640  HGB 11.9* 10.6*  HCT 37.0 31.9*    Assessment/Plan: Status post Cesarean section. Doing well postoperatively.  HCt 31 P:  iron Continue current care.  Fortino Sic 05/25/2012, 10:23 PM

## 2012-05-25 NOTE — Anesthesia Postprocedure Evaluation (Signed)
Anesthesia Post Note  Patient: Hospital doctor M Hnat  Procedure(s) Performed: Procedure(s) (LRB): CESAREAN SECTION (N/A)  Anesthesia type: SAB  Patient location: Mother/Baby  Post pain: Pain level controlled  Post assessment: Post-op Vital signs reviewed  Last Vitals:  Filed Vitals:   05/25/12 0600  BP: 107/69  Pulse: 55  Temp: 36.8 C  Resp: 20    Post vital signs: Reviewed  Level of consciousness: awake  Complications: No apparent anesthesia complications

## 2012-05-25 NOTE — Progress Notes (Signed)
CM / UR chart review completed.  

## 2012-05-26 NOTE — Progress Notes (Signed)
Subjective: Postpartum Day 2: Cesarean Delivery Patient reports tolerating PO and no problems voiding.    Objective: Vital signs in last 24 hours: Temp:  [98.1 F (36.7 C)-98.3 F (36.8 C)] 98.3 F (36.8 C) (05/01 1754) Pulse Rate:  [70-78] 70 (05/01 1754) Resp:  [16-18] 16 (05/01 1754) BP: (112-113)/(59-70) 113/59 mmHg (05/01 1754)  Physical Exam:  General: alert, cooperative and no distress Lochia: appropriate Uterine Fundus: firm Incision: no significant drainage DVT Evaluation: No evidence of DVT seen on physical exam.   Recent Labs  05/24/12 1220 05/25/12 0640  HGB 11.9* 10.6*  HCT 37.0 31.9*    Assessment/Plan: Status post Cesarean section. Doing well postoperatively.  Continue current care. Will discharge home tomorrow after 3 PM.  (Husband will be here then).  Joeanne Robicheaux E 05/26/2012, 8:35 PM

## 2012-05-27 NOTE — Progress Notes (Signed)
Subjective: Postpartum Day 3: Cesarean Delivery Patient reports tolerating PO, + BM and no problems voiding.    Objective: Vital signs in last 24 hours: Temp:  [98 F (36.7 C)-98.3 F (36.8 C)] 98 F (36.7 C) (05/02 0520) Pulse Rate:  [57-70] 57 (05/02 0520) Resp:  [16-18] 18 (05/02 0520) BP: (113-124)/(59-80) 124/80 mmHg (05/02 0520)  Physical Exam:  General: alert, cooperative and no distress Lochia: appropriate Uterine Fundus: firm Incision: no significant drainage DVT Evaluation: No evidence of DVT seen on physical exam.   Recent Labs  05/25/12 0640  HGB 10.6*  HCT 31.9*    Assessment/Plan: Status post Cesarean section. Doing well postoperatively.  Discharge home with standard precautions and return to clinic in 4-6 weeks.  Sonda Coppens E 05/27/2012, 2:58 PM

## 2012-05-27 NOTE — Discharge Summary (Signed)
Obstetric Discharge Summary Reason for Admission: cesarean section Prenatal Procedures: none Intrapartum Procedures: cesarean: low cervical, transverse Postpartum Procedures: none Complications-Operative and Postpartum: none  Hemoglobin  Date Value Range Status  05/25/2012 10.6* 12.0 - 15.0 g/dL Final     HCT  Date Value Range Status  05/25/2012 31.9* 36.0 - 46.0 % Final    Discharge Diagnoses: Term Pregnancy-delivered  Discharge Information: Date: 05/27/2012 Activity: pelvic rest Diet: routine Medications: Ibuprofen, Colace, Iron and Percocet Condition: stable Instructions: refer to practice specific booklet Discharge to: home   Newborn Data: Live born  Information for the patient's newborn:  [place], Girl Faven [[id number]]  female ; APGAR , ; weight ;  Home with mother.  Avilyn Virtue E 05/27/2012, 3:00 PM

## 2012-09-07 IMAGING — US US FETAL BPP W/O NONSTRESS
1 series · 14 of 19 positions shown · non-contrast
Comparison: none

[Series 1: us fetal bpp w/o nonstress · 0.28mm/px · 14 of 19 slices shown]
[im 1/19]
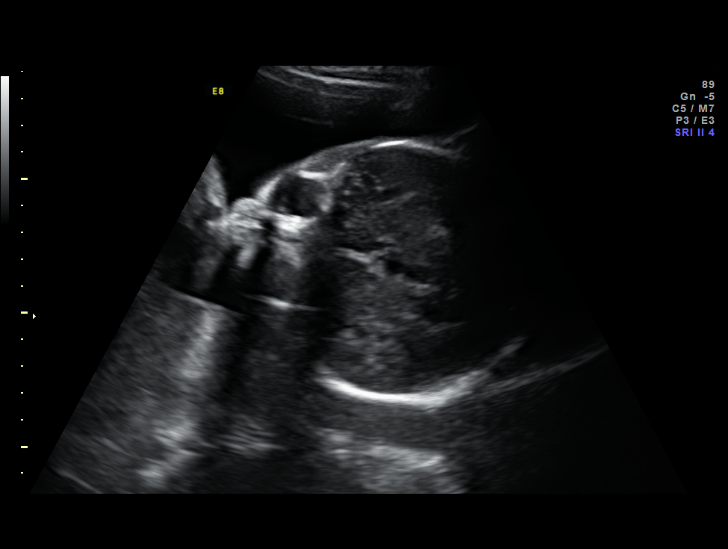
[im 3/19]
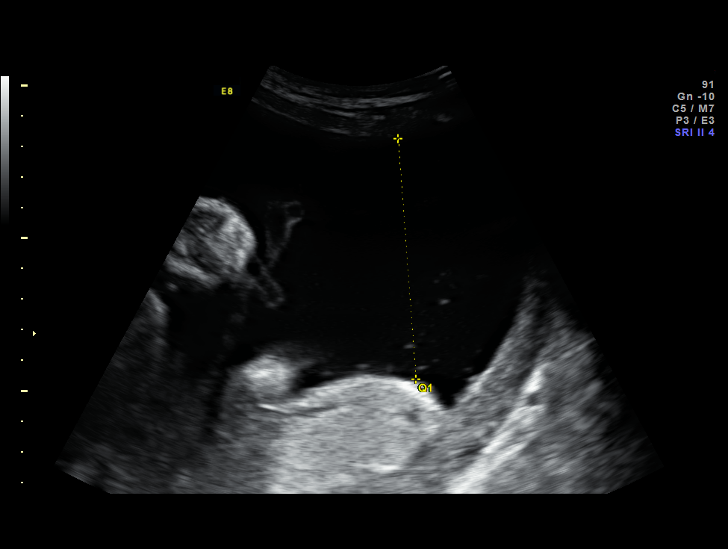
[im 4/19]
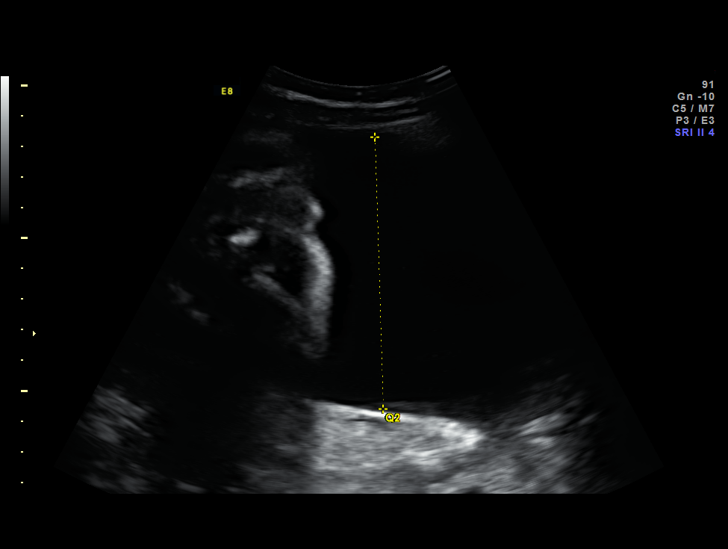
[im 5/19]
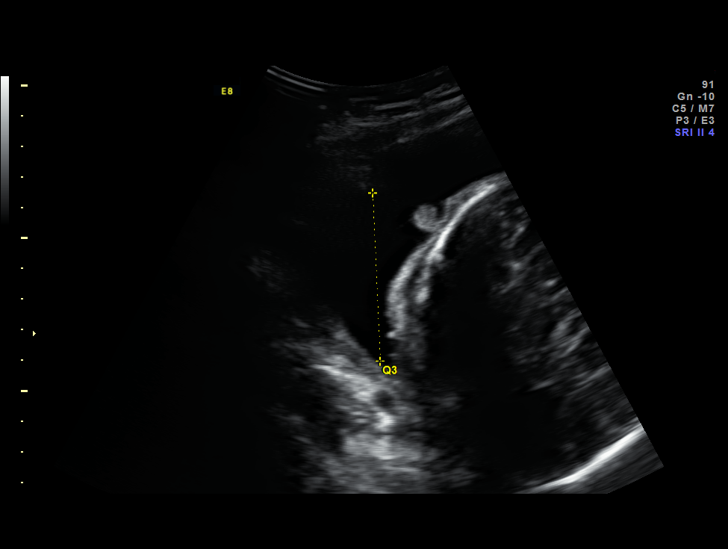
[im 7/19]
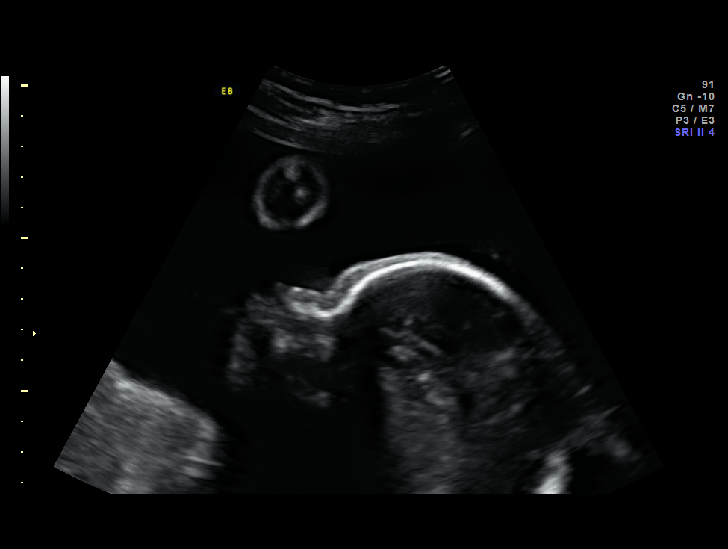
[im 8/19]
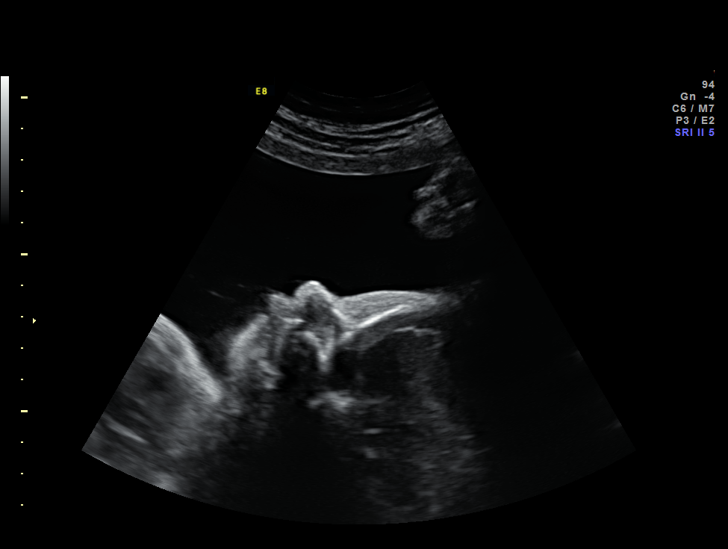
[im 9/19]
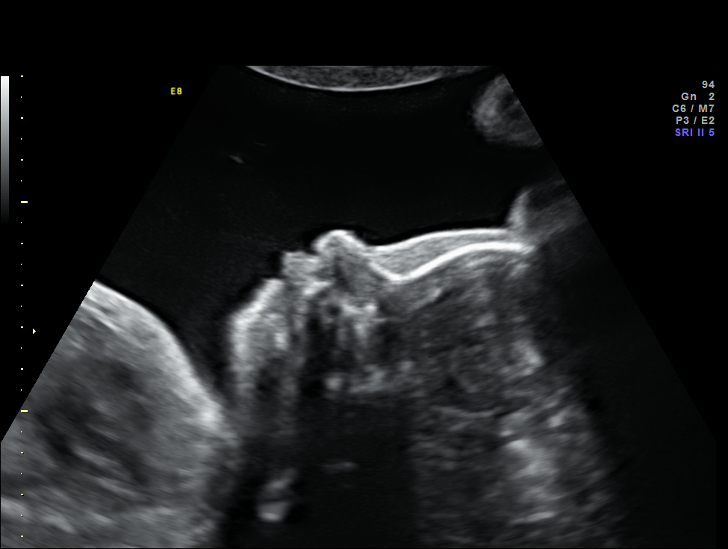
[im 11/19]
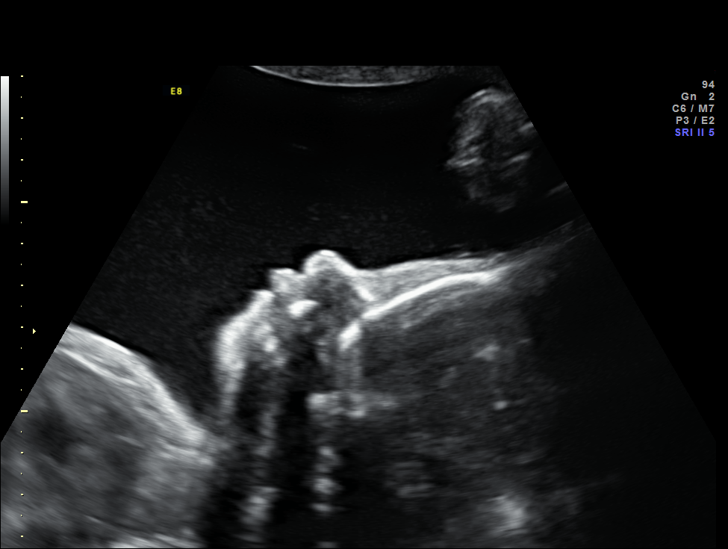
[im 12/19]
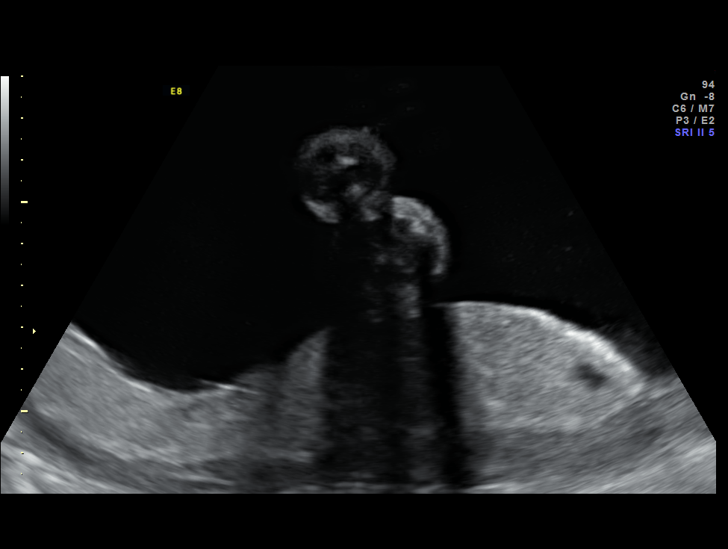
[im 13/19]
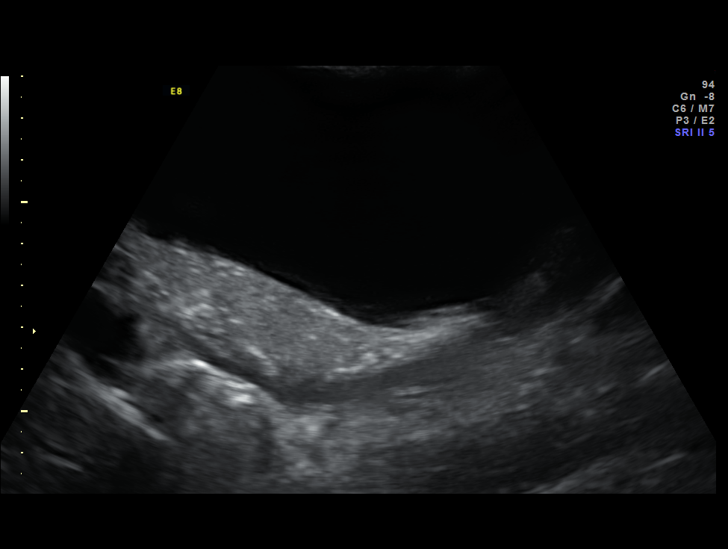
[im 15/19]
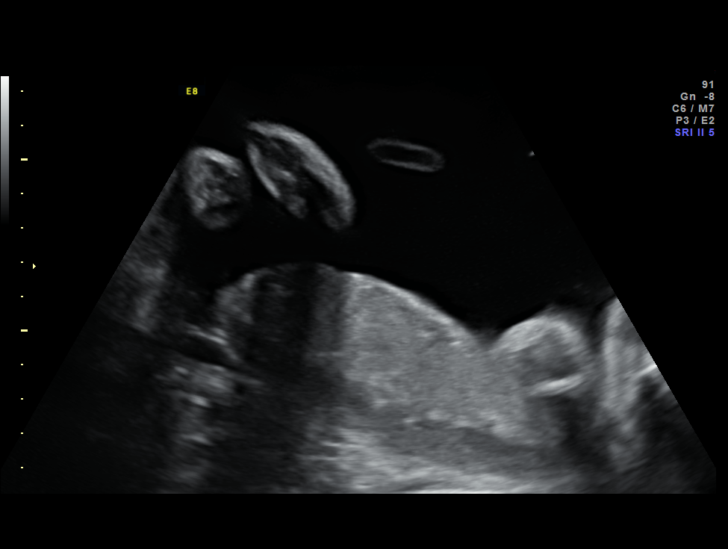
[im 16/19]
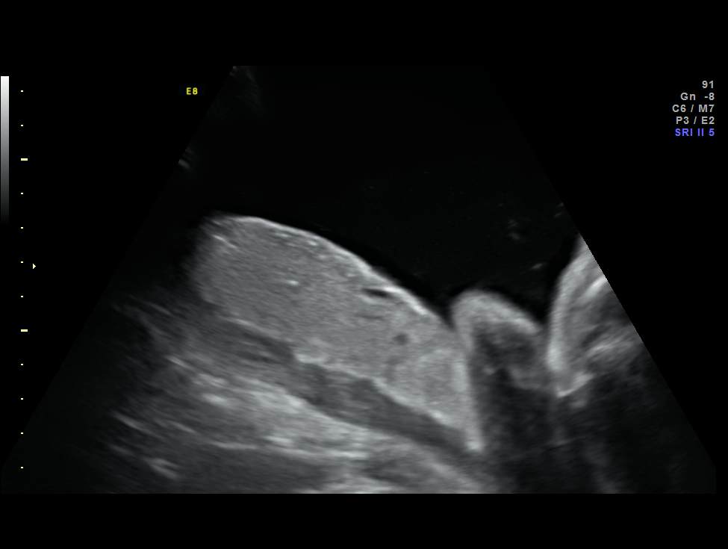
[im 17/19]
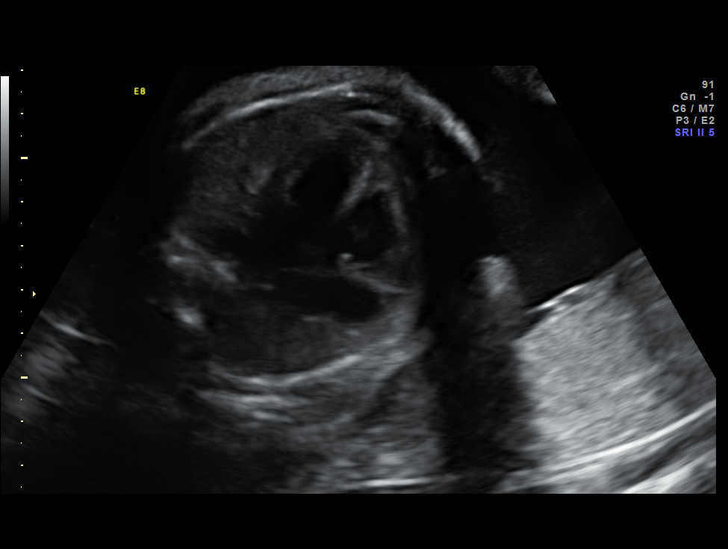
[im 19/19]
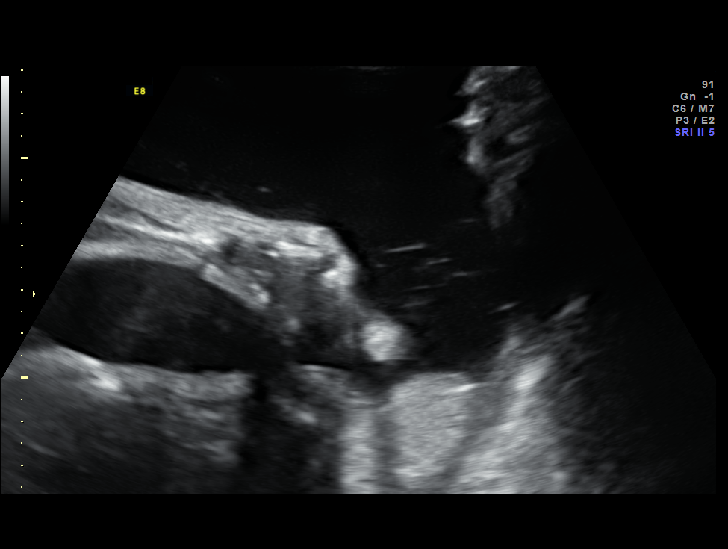

[14 of 19 positions shown; findings below may reference images not displayed]

Canned report from images found in remote index.

Refer to host system for actual result text.

## 2012-09-21 IMAGING — US US FETAL BPP W/O NONSTRESS
1 series · 14 of 28 positions shown · non-contrast
Comparison: none

[Series 1: us fetal bpp w/o nonstress · 29 acquisitions, 14 frames shown]
[im 2/29]
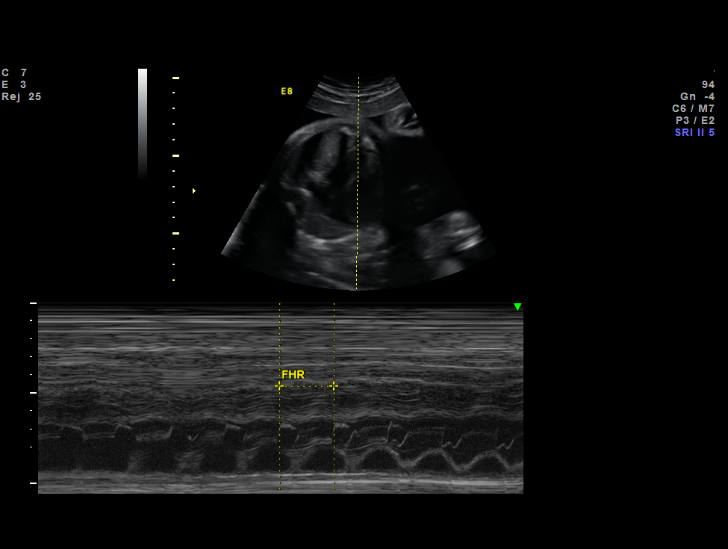
[im 4/29]
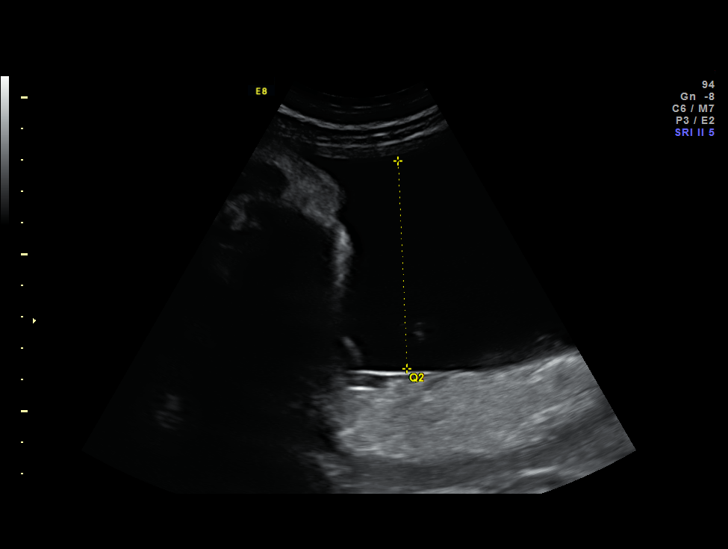
[im 6/29]
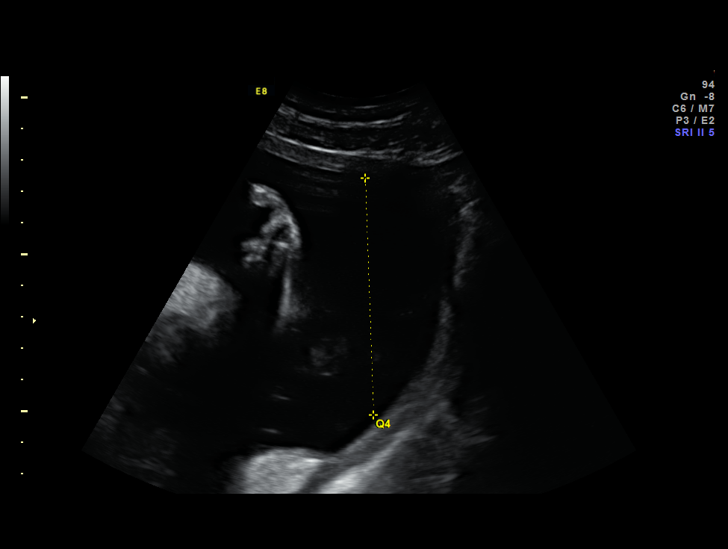
[im 8/29]
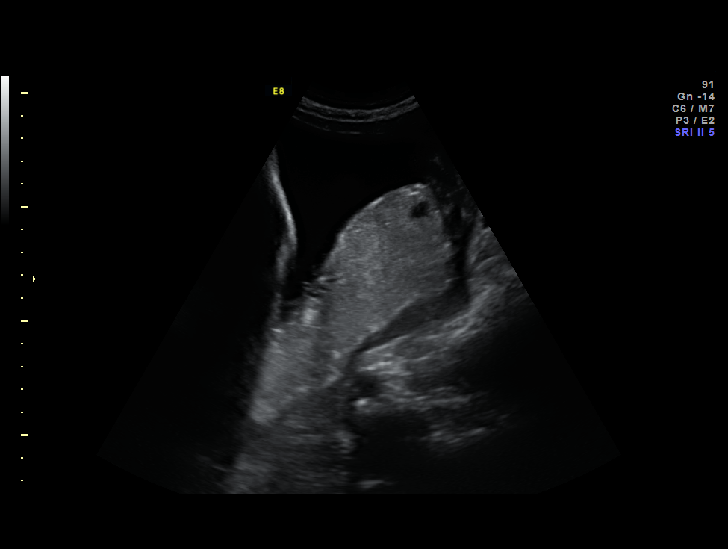
[im 10/29]
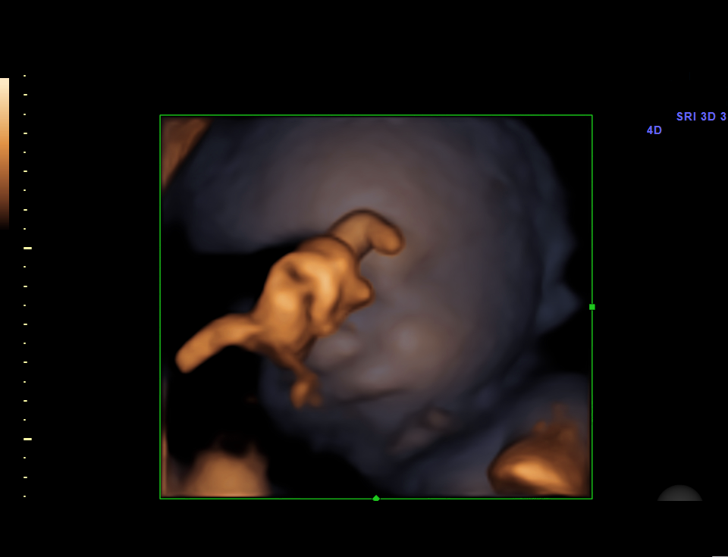
[im 12/29]
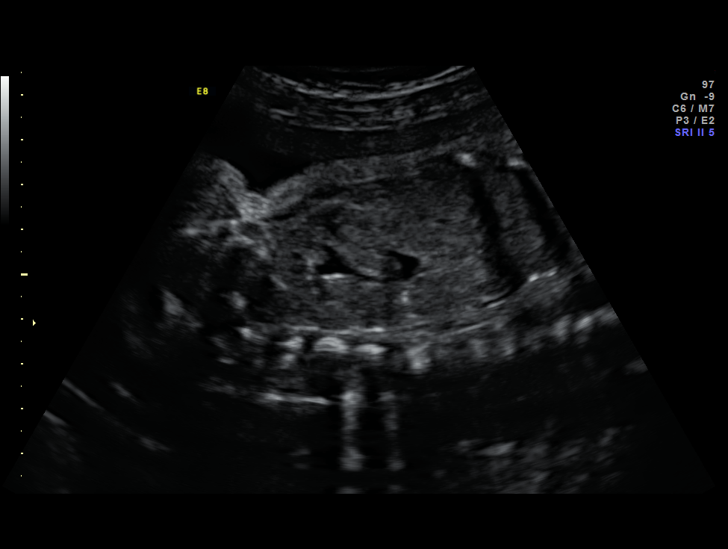
[im 14/29]
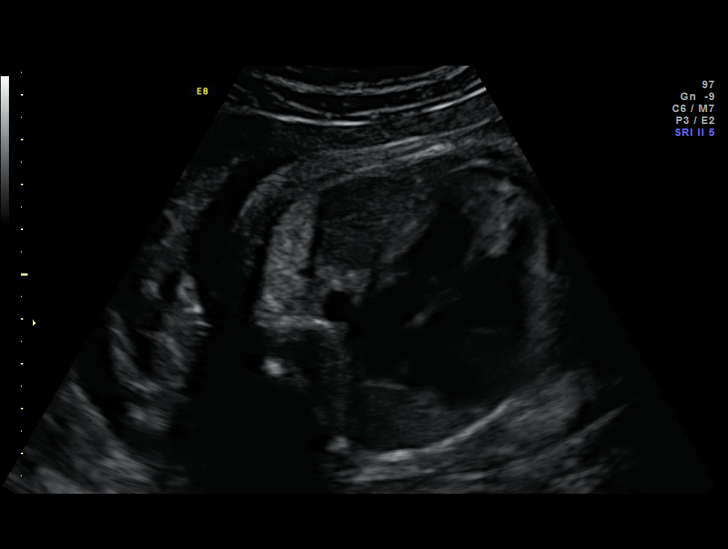
[im 16/29]
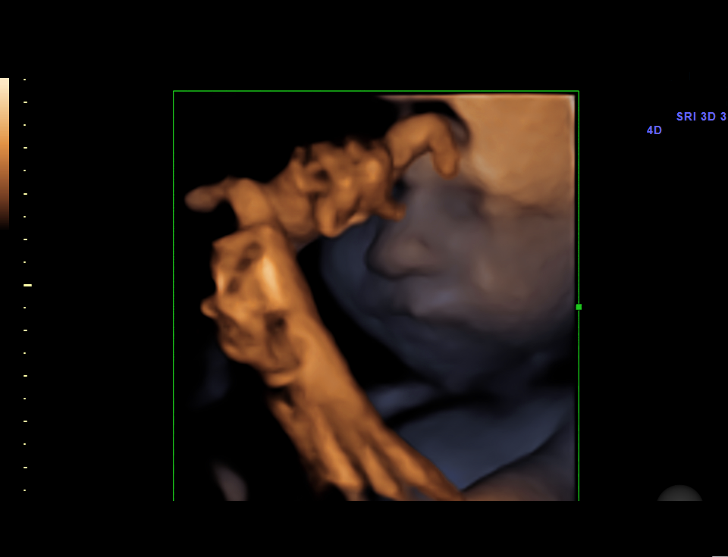
[im 18/29]
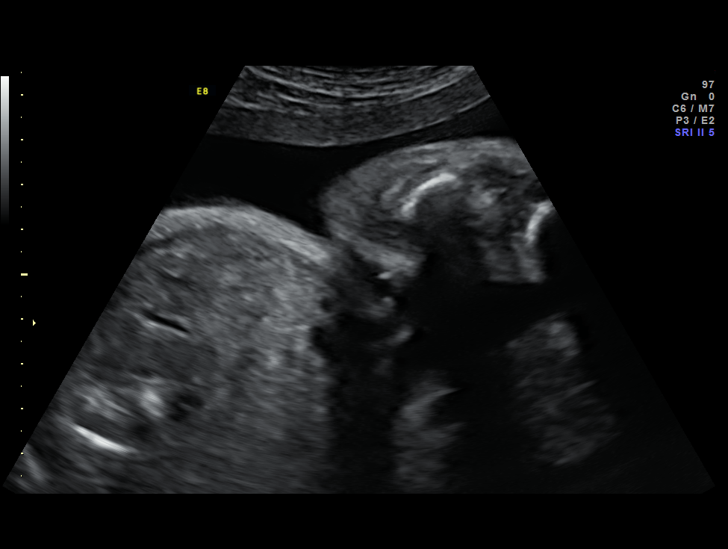
[im 20/29]
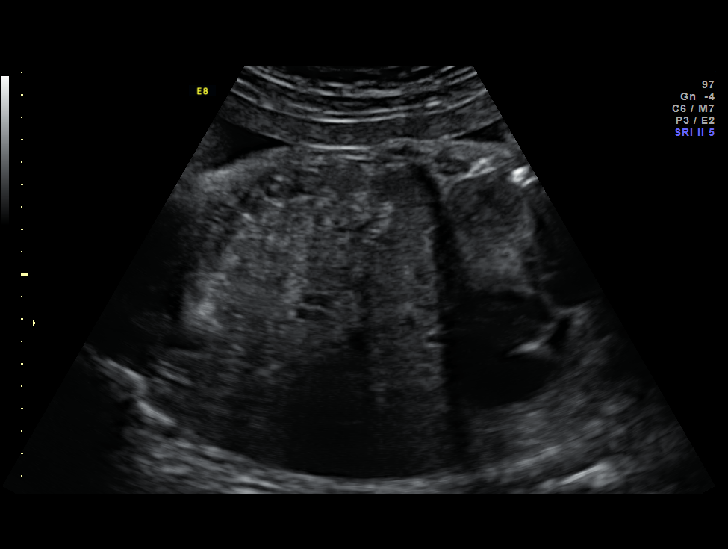
[im 22/29]
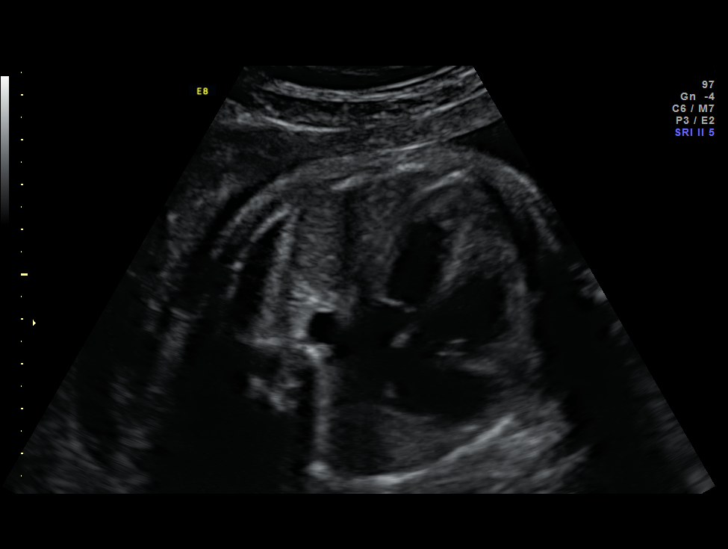
[im 24/29]
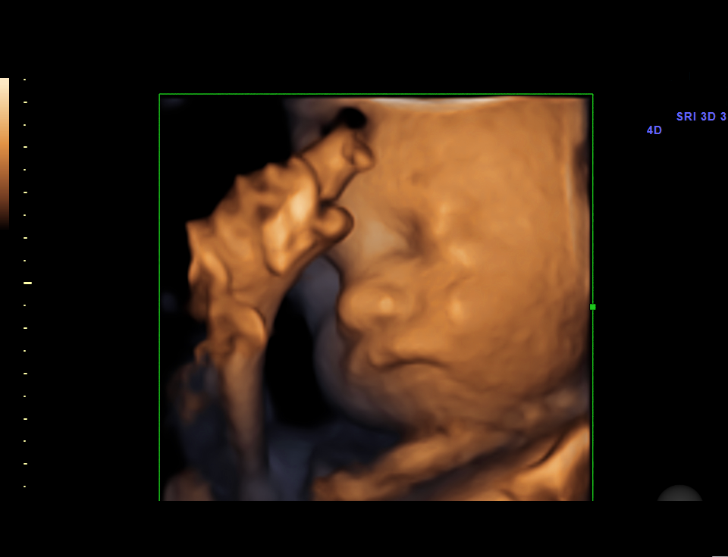
[im 26/29]
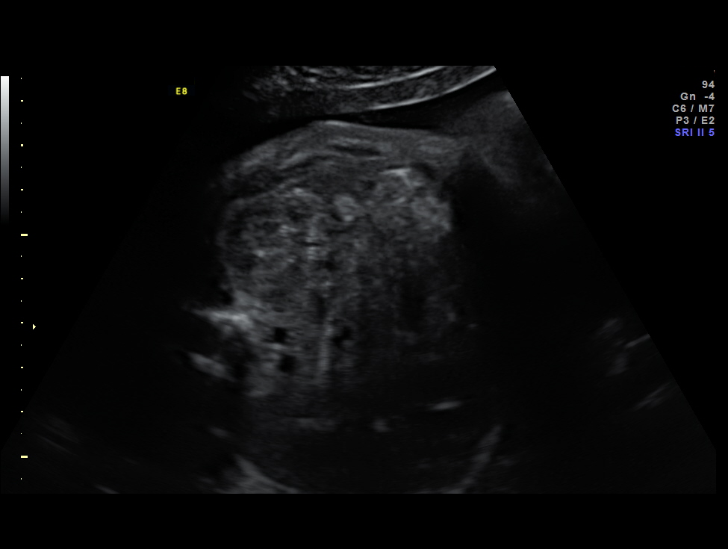
[im 29/29]
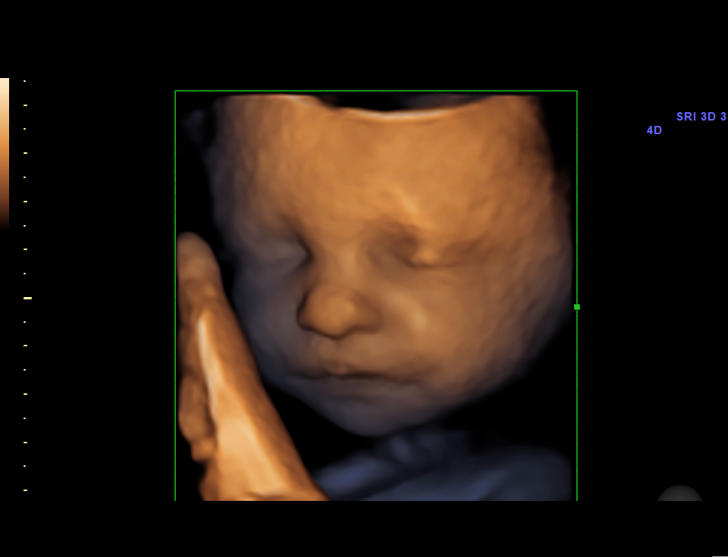

[14 of 28 positions shown; findings below may reference images not displayed]

Canned report from images found in remote index.

Refer to host system for actual result text.

## 2012-09-30 ENCOUNTER — Encounter (HOSPITAL_COMMUNITY): Payer: Self-pay | Admitting: Emergency Medicine

## 2012-09-30 ENCOUNTER — Emergency Department (INDEPENDENT_AMBULATORY_CARE_PROVIDER_SITE_OTHER)
Admission: EM | Admit: 2012-09-30 | Discharge: 2012-09-30 | Disposition: A | Discharge status: Home / Self Care | Payer: Self-pay | Source: Home / Self Care

## 2012-09-30 DIAGNOSIS — G44209 Tension-type headache, unspecified, not intractable: Secondary | ICD-10-CM

## 2012-09-30 MED ORDER — AMITRIPTYLINE HCL 50 MG PO TABS: 50.0000 mg | ORAL_TABLET | Freq: Every evening | ORAL | Status: DC | PRN

## 2012-09-30 NOTE — ED Notes (Signed)
C/o headache since Monday.  Patient reports sensitivity to light, neck and forehead.  No appetite.  One episode of vomiting yesterday.

## 2012-09-30 NOTE — ED Provider Notes (Signed)
CSN: [id number]     Arrival date & time 09/30/12  1243 History   None    Chief Complaint  Patient presents with  . Headache   (Consider location/radiation/quality/duration/timing/severity/associated sxs/prior Treatment) Patient is a 27 y.o. female presenting with headaches. The history is provided by the patient.  Headache Pain location:  Frontal and occipital Quality:  Dull Radiates to:  Does not radiate Onset quality:  Gradual Duration:  5 days Progression:  Unchanged Chronicity:  New Context: emotional stress   Associated symptoms: fatigue and nausea   Associated symptoms: no abdominal pain, no blurred vision, no congestion, no cough, no dizziness, no pain, no fever, no neck stiffness, no numbness, no photophobia and no visual change     Past Medical History  Diagnosis Date  . Migraine     otc med prn   Past Surgical History  Procedure Laterality Date  . Cesarean section      for breech  . Cesarean section  10/28/2010    Procedure: CESAREAN SECTION;  Surgeon: Fortino Sic, MD;  Location: [facility];  Service: Gynecology;  Laterality: N/A;  REPEAT INTRAUTERINE FETAL DEMISE  . Cesarean section N/A 05/24/2012    Procedure: CESAREAN SECTION;  Surgeon: Fortino Sic, MD;  Location: [facility];  Service: Obstetrics;  Laterality: N/A;   No family history on file. History  Substance Use Topics  . Smoking status: Never Smoker   . Smokeless tobacco: Never Used  . Alcohol Use: No   OB History   Grav Para Term Preterm Abortions TAB SAB Ect Mult Living   4 3 3  0 1 1 0 0 0 2     Review of Systems  Constitutional: Positive for fatigue. Negative for fever.  HENT: Negative for congestion and neck stiffness.   Eyes: Negative for blurred vision, photophobia and pain.  Respiratory: Negative for cough.   Gastrointestinal: Positive for nausea. Negative for abdominal pain.  Neurological: Positive for headaches. Negative for dizziness and numbness.    Allergies  Review of patient's  allergies indicates no known allergies.  Home Medications   Current Outpatient Rx  Name  Route  Sig  Dispense  Refill  . acetaminophen (TYLENOL) 325 MG tablet   Oral   Take 650 mg by mouth every 6 (six) hours as needed for pain.         Marland Kitchen amitriptyline (ELAVIL) 50 MG tablet   Oral   Take 1 tablet (50 mg total) by mouth at bedtime as needed for sleep.   30 tablet   0   . Prenatal Vit-Fe Fumarate-FA (PRENATAL MULTIVITAMIN) TABS   Oral   Take 1 tablet by mouth daily at 12 noon.          BP 111/82  Pulse 74  Temp(Src) 98.9 F (37.2 C) (Oral)  Resp 16  SpO2 100%  LMP 09/20/2012 Physical Exam  Nursing note and vitals reviewed. Constitutional: She is oriented to person, place, and time. She appears well-developed and well-nourished.  HENT:  Head: Normocephalic.  Right Ear: External ear normal.  Left Ear: External ear normal.  Mouth/Throat: Oropharynx is clear and moist.  Eyes: Conjunctivae and EOM are normal. Pupils are equal, round, and reactive to light.  Neck: Normal range of motion. Neck supple. Muscular tenderness present. No spinous process tenderness present. No rigidity. Normal range of motion present. No Brudzinski's sign and no Kernig's sign noted.  Cardiovascular: Regular rhythm.   Pulmonary/Chest: Breath sounds normal.  Abdominal: Soft. Bowel sounds are normal. There  is no tenderness.  Lymphadenopathy:    She has no cervical adenopathy.  Neurological: She is alert and oriented to person, place, and time.  Skin: Skin is warm and dry. No rash noted.    ED Course  Procedures (including critical care time) Labs Review Labs Reviewed - No data to display Imaging Review No results found.  MDM   1. Tension type headache        Linna Hoff, MD 09/30/12 1434

## 2012-09-30 NOTE — ED Notes (Signed)
Given pillows and blankets

## 2013-01-26 DIAGNOSIS — A599 Trichomoniasis, unspecified: Secondary | ICD-10-CM

## 2013-01-26 HISTORY — DX: Trichomoniasis, unspecified: A59.9

## 2013-11-27 ENCOUNTER — Encounter (HOSPITAL_COMMUNITY): Payer: Self-pay | Admitting: Emergency Medicine

## 2013-12-19 ENCOUNTER — Encounter (HOSPITAL_BASED_OUTPATIENT_CLINIC_OR_DEPARTMENT_OTHER): Payer: Self-pay | Admitting: *Deleted

## 2013-12-19 ENCOUNTER — Emergency Department (HOSPITAL_BASED_OUTPATIENT_CLINIC_OR_DEPARTMENT_OTHER)
Admission: EM | Admit: 2013-12-19 | Discharge: 2013-12-19 | Disposition: A | Discharge status: Home / Self Care | Payer: Self-pay | Attending: Emergency Medicine | Admitting: Emergency Medicine

## 2013-12-19 DIAGNOSIS — K047 Periapical abscess without sinus: Secondary | ICD-10-CM | POA: Insufficient documentation

## 2013-12-19 DIAGNOSIS — G43909 Migraine, unspecified, not intractable, without status migrainosus: Secondary | ICD-10-CM | POA: Insufficient documentation

## 2013-12-19 DIAGNOSIS — Z79899 Other long term (current) drug therapy: Secondary | ICD-10-CM | POA: Insufficient documentation

## 2013-12-19 MED ORDER — OXYCODONE-ACETAMINOPHEN 5-325 MG PO TABS: 1.0000 | ORAL_TABLET | Freq: Four times a day (QID) | ORAL | Status: DC | PRN

## 2013-12-19 MED ORDER — PENICILLIN V POTASSIUM 500 MG PO TABS: 500.0000 mg | ORAL_TABLET | Freq: Three times a day (TID) | ORAL | Status: DC

## 2013-12-19 NOTE — Discharge Instructions (Signed)
Return to the ED with any concerns including vomiting and not able to keep down liquids or antibitoics, difficulty swallowing, decreased level of alertness/lethargy, or any other alarming symptoms

## 2013-12-19 NOTE — ED Provider Notes (Signed)
CSN: [id number]     Arrival date & time 12/19/13  1254 History   First MD Initiated Contact with Patient 12/19/13 1356     Chief Complaint  Patient presents with  . Oral Swelling     (Consider location/radiation/quality/duration/timing/severity/associated sxs/prior Treatment) HPI  Pt presenting with pain in right side of mouth.  She states she has been having pain in both upper and lower right teeth for approx 2 weeks.  This morning she awoke and the right side of her jaw is swollen.  No difficulty breathing or swallowing.  No fever/chills.  He has taken tylenol which has not helped with her pain.  Pain worse with chewing and with cold liquids.  There are no other associated systemic symptoms, there are no other alleviating or modifying factors.   Past Medical History  Diagnosis Date  . Migraine     otc med prn   Past Surgical History  Procedure Laterality Date  . Cesarean section      for breech  . Cesarean section  10/28/2010    Procedure: CESAREAN SECTION;  Surgeon: Fortino SicEleanor E Greene, MD;  Location: [facility];  Service: Gynecology;  Laterality: N/A;  REPEAT INTRAUTERINE FETAL DEMISE  . Cesarean section N/A 05/24/2012    Procedure: CESAREAN SECTION;  Surgeon: Fortino SicEleanor E Greene, MD;  Location: [facility];  Service: Obstetrics;  Laterality: N/A;   No family history on file. History  Substance Use Topics  . Smoking status: Never Smoker   . Smokeless tobacco: Never Used  . Alcohol Use: No   OB History    Gravida Para Term Preterm AB TAB SAB Ectopic Multiple Living   4 3 3  0 1 1 0 0 0 2     Review of Systems  ROS reviewed and all otherwise negative except for mentioned in HPI    Allergies  Review of patient's allergies indicates no known allergies.  Home Medications   Prior to Admission medications   Medication Sig Start Date End Date Taking? Authorizing Provider  acetaminophen (TYLENOL) 325 MG tablet Take 650 mg by mouth every 6 (six) hours as needed for pain.    Historical  Provider, MD  amitriptyline (ELAVIL) 50 MG tablet Take 1 tablet (50 mg total) by mouth at bedtime as needed for sleep. 09/30/12   Linna HoffJames D Kindl, MD  oxyCODONE-acetaminophen (PERCOCET/ROXICET) 5-325 MG per tablet Take 1-2 tablets by mouth every 6 (six) hours as needed for severe pain. 12/19/13   Ethelda ChickMartha K Linker, MD  penicillin v potassium (VEETID) 500 MG tablet Take 1 tablet (500 mg total) by mouth 3 (three) times daily. 12/19/13   Ethelda ChickMartha K Linker, MD  Prenatal Vit-Fe Fumarate-FA (PRENATAL MULTIVITAMIN) TABS Take 1 tablet by mouth daily at 12 noon.    Historical Provider, MD   BP 106/69 mmHg  Pulse 76  Temp(Src) 97.9 F (36.6 C) (Oral)  Resp 18  Ht 5\' 4"  (1.626 m)  Wt 138 lb (62.596 kg)  BMI 23.68 kg/m2  SpO2 100%  LMP 12/09/2013  Vitals reviewed Physical Exam  Physical Examination: General appearance - alert, well appearing, and in no distress Mental status - alert, oriented to person, place, and time Eyes - no conjunctival injection, no scleral icterus Mouth - mucous membranes moist, pharynx normal without lesions, swelling of right face, no swelling under tongue, no palpable gingival abscess, decay of right lower molar Neck - supple, no significant adenopathy Chest- normal respiratory effort, lungs CTAB Extremities - peripheral pulses normal, no pedal edema, no clubbing  or cyanosis Skin - normal coloration and turgor, no rashes  ED Course  Procedures (including critical care time) Labs Review Labs Reviewed - No data to display  Imaging Review No results found.   EKG Interpretation None      MDM   Final diagnoses:  Abscessed tooth    Pt presenting with c/o dental pain and facial swelling.   Patient is overall nontoxic and well hydrated in appearance.   Pt treated with pain medication and started on antibiotics, she was advised to arrange for followup  With dentist.  Discharged with strict return precautions.  Pt agreeable with plan.     Ethelda ChickMartha K Linker, MD 12/20/13  [phone]

## 2013-12-19 NOTE — ED Notes (Signed)
Dental pain x 2 weeks. Woke with swelling in her right jaw.

## 2014-09-15 ENCOUNTER — Encounter (HOSPITAL_BASED_OUTPATIENT_CLINIC_OR_DEPARTMENT_OTHER): Payer: Self-pay | Admitting: Emergency Medicine

## 2014-09-15 ENCOUNTER — Emergency Department (HOSPITAL_BASED_OUTPATIENT_CLINIC_OR_DEPARTMENT_OTHER)
Admission: EM | Admit: 2014-09-15 | Discharge: 2014-09-15 | Disposition: A | Discharge status: Home / Self Care | Payer: Self-pay | Attending: Emergency Medicine | Admitting: Emergency Medicine

## 2014-09-15 DIAGNOSIS — J029 Acute pharyngitis, unspecified: Secondary | ICD-10-CM | POA: Insufficient documentation

## 2014-09-15 DIAGNOSIS — Z79899 Other long term (current) drug therapy: Secondary | ICD-10-CM | POA: Insufficient documentation

## 2014-09-15 DIAGNOSIS — G43909 Migraine, unspecified, not intractable, without status migrainosus: Secondary | ICD-10-CM | POA: Insufficient documentation

## 2014-09-15 LAB — RAPID STREP SCREEN (MED CTR MEBANE ONLY): STREPTOCOCCUS, GROUP A SCREEN (DIRECT): NEGATIVE

## 2014-09-15 MED ORDER — PREDNISONE 10 MG PO TABS: 20.0000 mg | ORAL_TABLET | Freq: Two times a day (BID) | ORAL | Status: DC | PRN

## 2014-09-15 NOTE — Discharge Instructions (Signed)
Ibuprofen 600 mg every 6 hours as needed for pain.  Prednisone as prescribed as needed for pain if ibuprofen is not helping.  We will call you if your cultures indicate you require further treatment.   Pharyngitis Pharyngitis is redness, pain, and swelling (inflammation) of your pharynx.  CAUSES  Pharyngitis is usually caused by infection. Most of the time, these infections are from viruses (viral) and are part of a cold. However, sometimes pharyngitis is caused by bacteria (bacterial). Pharyngitis can also be caused by allergies. Viral pharyngitis may be spread from person to person by coughing, sneezing, and personal items or utensils (cups, forks, spoons, toothbrushes). Bacterial pharyngitis may be spread from person to person by more intimate contact, such as kissing.  SIGNS AND SYMPTOMS  Symptoms of pharyngitis include:   Sore throat.   Tiredness (fatigue).   Low-grade fever.   Headache.  Joint pain and muscle aches.  Skin rashes.  Swollen lymph nodes.  Plaque-like film on throat or tonsils (often seen with bacterial pharyngitis). DIAGNOSIS  Your health care provider will ask you questions about your illness and your symptoms. Your medical history, along with a physical exam, is often all that is needed to diagnose pharyngitis. Sometimes, a rapid strep test is done. Other lab tests may also be done, depending on the suspected cause.  TREATMENT  Viral pharyngitis will usually get better in 3-4 days without the use of medicine. Bacterial pharyngitis is treated with medicines that kill germs (antibiotics).  HOME CARE INSTRUCTIONS   Drink enough water and fluids to keep your urine clear or pale yellow.   Only take over-the-counter or prescription medicines as directed by your health care provider:   If you are prescribed antibiotics, make sure you finish them even if you start to feel better.   Do not take aspirin.   Get lots of rest.   Gargle with 8 oz of salt  water ( tsp of salt per 1 qt of water) as often as every 1-2 hours to soothe your throat.   Throat lozenges (if you are not at risk for choking) or sprays may be used to soothe your throat. SEEK MEDICAL CARE IF:   You have large, tender lumps in your neck.  You have a rash.  You cough up green, yellow-brown, or bloody spit. SEEK IMMEDIATE MEDICAL CARE IF:   Your neck becomes stiff.  You drool or are unable to swallow liquids.  You vomit or are unable to keep medicines or liquids down.  You have severe pain that does not go away with the use of recommended medicines.  You have trouble breathing (not caused by a stuffy nose). MAKE SURE YOU:   Understand these instructions.  Will watch your condition.  Will get help right away if you are not doing well or get worse. Document Released: 01/12/2005 Document Revised: 11/02/2012 Document Reviewed: 09/19/2012 [facility] Patient Information 2015 [place], [place]. This information is not intended to replace advice given to you by your health care provider. Make sure you discuss any questions you have with your health care provider.

## 2014-09-15 NOTE — ED Notes (Signed)
Patient reports that she is having pain in her throat. The patient reports that it is the worst pain that she has ever had

## 2014-09-15 NOTE — ED Provider Notes (Signed)
CSN: [id number]     Arrival date & time 09/15/14  2021 History  This chart was scribed for Geoffery Lyons, MD by Budd Palmer, ED Scribe. This patient was seen in room MH05/MH05 and the patient's care was started at 10:50 PM.    Chief Complaint  Patient presents with  . Sore Throat   Patient is a 29 y.o. female presenting with pharyngitis. The history is provided by the patient. No language interpreter was used.  Sore Throat This is a new problem. The current episode started yesterday. The problem occurs constantly. The problem has not changed since onset.  HPI Comments: Sara Coleman is a 29 y.o. female who presents to the Emergency Department complaining of a sore throat onset 1 day ago. She notes associated pain when swallowing. Pt has taken an ibuprofen with moderate relief. She has had no sick contacts. She is healthy otherwise and not on any medication besides BCP.Pt denies fever and cough.  Past Medical History  Diagnosis Date  . Migraine     otc med prn   Past Surgical History  Procedure Laterality Date  . Cesarean section      for breech  . Cesarean section  10/28/2010    Procedure: CESAREAN SECTION;  Surgeon: Fortino Sic, MD;  Location: [facility];  Service: Gynecology;  Laterality: N/A;  REPEAT INTRAUTERINE FETAL DEMISE  . Cesarean section N/A 05/24/2012    Procedure: CESAREAN SECTION;  Surgeon: Fortino Sic, MD;  Location: [facility];  Service: Obstetrics;  Laterality: N/A;   History reviewed. No pertinent family history. Social History  Substance Use Topics  . Smoking status: Never Smoker   . Smokeless tobacco: Never Used  . Alcohol Use: No   OB History    Gravida Para Term Preterm AB TAB SAB Ectopic Multiple Living   0 1 1 0 0 0 2     Review of Systems  Constitutional: Negative for fever.  HENT: Positive for sore throat and trouble swallowing.   Respiratory: Negative for cough.     Allergies  Review of patient's allergies indicates no known  allergies.  Home Medications   Prior to Admission medications   Medication Sig Start Date End Date Taking? Authorizing Provider  acetaminophen (TYLENOL) 325 MG tablet Take 650 mg by mouth every 6 (six) hours as needed for pain.    Historical Provider, MD  amitriptyline (ELAVIL) 50 MG tablet Take 1 tablet (50 mg total) by mouth at bedtime as needed for sleep. 09/30/12   Linna Hoff, MD  oxyCODONE-acetaminophen (PERCOCET/ROXICET) 5-325 MG per tablet Take 1-2 tablets by mouth every 6 (six) hours as needed for severe pain. 12/19/13   Jerelyn Scott, MD  penicillin v potassium (VEETID) 500 MG tablet Take 1 tablet (500 mg total) by mouth 3 (three) times daily. 12/19/13   Jerelyn Scott, MD  Prenatal Vit-Fe Fumarate-FA (PRENATAL MULTIVITAMIN) TABS Take 1 tablet by mouth daily at 12 noon.    Historical Provider, MD   BP 131/88 mmHg  Pulse 75  Temp(Src) 98.6 F (37 C) (Oral)  Resp 20  Ht  (1.6 m)  Wt 140 lb (63.504 kg)  BMI 24.81 kg/m2  SpO2 99%  LMP 09/08/2014 Physical Exam  Constitutional: She appears well-developed and well-nourished. No distress.  HENT:  Head: Normocephalic and atraumatic.  Mouth/Throat: No oropharyngeal exudate.  Mild erythema of the PO  Eyes: Conjunctivae and EOM are normal. Pupils are equal, round, and reactive to light. Right eye exhibits  no discharge. Left eye exhibits no discharge. No scleral icterus.  Neck: Normal range of motion. Neck supple. No JVD present. No thyromegaly present.  Cardiovascular: Normal rate, regular rhythm, normal heart sounds and intact distal pulses.  Exam reveals no gallop and no friction rub.   No murmur heard. Pulmonary/Chest: Effort normal and breath sounds normal. No respiratory distress. She has no wheezes. She has no rales.  Abdominal: Soft. Bowel sounds are normal. She exhibits no distension and no mass. There is no tenderness.  Musculoskeletal: Normal range of motion. She exhibits no edema or tenderness.  Lymphadenopathy:     She has no cervical adenopathy.  Neurological: She is alert. Coordination normal.  Skin: Skin is warm and dry. No rash noted. No erythema.  Psychiatric: She has a normal mood and affect. Her behavior is normal.  Nursing note and vitals reviewed.   ED Course  Procedures  DIAGNOSTIC STUDIES: Oxygen Saturation is 99% on RA, normal by my interpretation.    COORDINATION OF CARE: 10:55 PM - Discussed negative rapid strep test and probable viral infection. Discussed plans to discharge. Advised to continue taking ibuprofen and return to ED is Sx worsen. Pt advised of plan for treatment and pt agrees.  Labs Review Labs Reviewed  RAPID STREP SCREEN (NOT AT [facility])  CULTURE, GROUP A STREP    Imaging Review No results found. I have personally reviewed and evaluated these images and lab results as part of my medical decision-making.   EKG Interpretation None      MDM   Final diagnoses:  None    Strep test negative. Symptoms likely viral in nature. Will recommend anti-inflammatory's and when necessary return.  I personally performed the services described in this documentation, which was scribed in my presence. The recorded information has been reviewed and is accurate.      Geoffery Lyons, MD 09/15/14 2257

## 2014-09-19 LAB — CULTURE, GROUP A STREP: STREP A CULTURE: NEGATIVE

## 2015-01-09 ENCOUNTER — Emergency Department (HOSPITAL_BASED_OUTPATIENT_CLINIC_OR_DEPARTMENT_OTHER)
Admission: EM | Admit: 2015-01-09 | Discharge: 2015-01-09 | Disposition: A | Discharge status: Home / Self Care | Payer: Self-pay | Attending: Emergency Medicine | Admitting: Emergency Medicine

## 2015-01-09 ENCOUNTER — Encounter (HOSPITAL_BASED_OUTPATIENT_CLINIC_OR_DEPARTMENT_OTHER): Payer: Self-pay

## 2015-01-09 DIAGNOSIS — Z79899 Other long term (current) drug therapy: Secondary | ICD-10-CM | POA: Insufficient documentation

## 2015-01-09 DIAGNOSIS — N3091 Cystitis, unspecified with hematuria: Secondary | ICD-10-CM | POA: Insufficient documentation

## 2015-01-09 DIAGNOSIS — Z8679 Personal history of other diseases of the circulatory system: Secondary | ICD-10-CM | POA: Insufficient documentation

## 2015-01-09 DIAGNOSIS — Z3202 Encounter for pregnancy test, result negative: Secondary | ICD-10-CM | POA: Insufficient documentation

## 2015-01-09 LAB — URINALYSIS, ROUTINE W REFLEX MICROSCOPIC
BILIRUBIN URINE: NEGATIVE
GLUCOSE, UA: NEGATIVE mg/dL
KETONES UR: 15 mg/dL — AB
Nitrite: NEGATIVE
PH: 6 (ref 5.0–8.0)
PROTEIN: 100 mg/dL — AB
Specific Gravity, Urine: 1.025 (ref 1.005–1.030)

## 2015-01-09 LAB — URINE MICROSCOPIC-ADD ON

## 2015-01-09 LAB — PREGNANCY, URINE: Preg Test, Ur: NEGATIVE

## 2015-01-09 MED ORDER — PHENAZOPYRIDINE HCL 200 MG PO TABS: 200.0000 mg | ORAL_TABLET | Freq: Three times a day (TID) | ORAL | Status: DC | PRN

## 2015-01-09 MED ORDER — SULFAMETHOXAZOLE-TRIMETHOPRIM 800-160 MG PO TABS
1.0000 | ORAL_TABLET | Freq: Once | ORAL | Status: AC
  Administered 2015-01-09: 1 via ORAL
  Filled 2015-01-09: qty 1

## 2015-01-09 MED ORDER — SULFAMETHOXAZOLE-TRIMETHOPRIM 800-160 MG PO TABS: 1.0000 | ORAL_TABLET | Freq: Two times a day (BID) | ORAL | Status: AC

## 2015-01-09 MED ORDER — PHENAZOPYRIDINE HCL 100 MG PO TABS
200.0000 mg | ORAL_TABLET | Freq: Once | ORAL | Status: AC
  Administered 2015-01-09: 200 mg via ORAL
  Filled 2015-01-09: qty 2

## 2015-01-09 NOTE — ED Provider Notes (Signed)
CSN: [id number]     Arrival date & time 01/09/15  1719 History   First MD Initiated Contact with Patient 01/09/15 1738     Chief Complaint  Patient presents with  . Dysuria     (Consider location/radiation/quality/duration/timing/severity/associated sxs/prior Treatment) HPI Complains of dysuria and burning at urethral meatus, nonradiating with urination onset last night. No other associated symptoms. No fever no nausea or vomiting. No treatment prior to coming here. Pain is worse with urination not improved by anything. No vaginal discharge last normal menstrual period one week ago Past Medical History  Diagnosis Date  . Migraine     otc med prn   Past Surgical History  Procedure Laterality Date  . Cesarean section      for breech  . Cesarean section  10/28/2010    Procedure: CESAREAN SECTION;  Surgeon: Fortino Sic, MD;  Location: [facility];  Service: Gynecology;  Laterality: N/A;  REPEAT INTRAUTERINE FETAL DEMISE  . Cesarean section N/A 05/24/2012    Procedure: CESAREAN SECTION;  Surgeon: Fortino Sic, MD;  Location: [facility];  Service: Obstetrics;  Laterality: N/A;   No family history on file. Social History  Substance Use Topics  . Smoking status: Never Smoker   . Smokeless tobacco: Never Used  . Alcohol Use: No   OB History    Gravida Para Term Preterm AB TAB SAB Ectopic Multiple Living   0 1 1 0 0 0 2     Review of Systems  Constitutional: Negative.   Gastrointestinal: Negative.   Genitourinary: Positive for dysuria.      Allergies  Review of patient's allergies indicates no known allergies.  Home Medications   Prior to Admission medications   Medication Sig Start Date End Date Taking? Authorizing Provider  Prenatal Vit-Fe Fumarate-FA (PRENATAL MULTIVITAMIN) TABS Take 1 tablet by mouth daily at 12 noon.    Historical Provider, MD   BP 131/80 mmHg  Pulse 79  Temp(Src) 98.4 F (36.9 C) (Oral)  Resp 16  Ht  (1.626 m)  Wt 138 lb (62.596 kg)   BMI 23.68 kg/m2  SpO2 100%  LMP 01/02/2015 Physical Exam  Constitutional: She appears well-developed and well-nourished. No distress.  HENT:  Head: Normocephalic and atraumatic.  Eyes: Conjunctivae are normal. Pupils are equal, round, and reactive to light.  Neck: Neck supple. No tracheal deviation present. No thyromegaly present.  Cardiovascular: Normal rate and regular rhythm.   No murmur heard. Pulmonary/Chest: Effort normal and breath sounds normal.  Abdominal: Soft. Bowel sounds are normal. She exhibits no distension. There is tenderness.  Mild suprapubic tenderness  Musculoskeletal: Normal range of motion. She exhibits no edema or tenderness.  Neurological: She is alert. Coordination normal.  Skin: Skin is warm and dry. No rash noted.  Psychiatric: She has a normal mood and affect.  Nursing note and vitals reviewed.   ED Course  Procedures (including critical care time) Labs Review Labs Reviewed  URINALYSIS, ROUTINE W REFLEX MICROSCOPIC (NOT AT [facility])  PREGNANCY, URINE    Imaging Review No results found. I have personally reviewed and evaluated these images and lab results as part of my medical decision-making.   EKG Interpretation None      Results for orders placed or performed during the hospital encounter of 01/09/15  Urinalysis, Routine w reflex microscopic (not at [facility])  Result Value Ref Range   Color, Urine YELLOW YELLOW   APPearance TURBID (A) CLEAR   Specific Gravity, Urine 1.025 1.005 -  1.030   pH 6.0 5.0 - 8.0   Glucose, UA NEGATIVE NEGATIVE mg/dL   Hgb urine dipstick LARGE (A) NEGATIVE   Bilirubin Urine NEGATIVE NEGATIVE   Ketones, ur 15 (A) NEGATIVE mg/dL   Protein, ur 161100 (A) NEGATIVE mg/dL   Nitrite NEGATIVE NEGATIVE   Leukocytes, UA LARGE (A) NEGATIVE  Pregnancy, urine  Result Value Ref Range   Preg Test, Ur NEGATIVE NEGATIVE  Urine microscopic-add on  Result Value Ref Range   Squamous Epithelial / LPF 0-5 (A) NONE SEEN   WBC, UA TOO  NUMEROUS TO COUNT 0 - 5 WBC/hpf   RBC / HPF TOO NUMEROUS TO COUNT 0 - 5 RBC/hpf   Bacteria, UA MANY (A) NONE SEEN   Urine-Other MUCOUS PRESENT    No results found.   MDM  Plan prescription Bactrim DS, pyridium referral resource guide to get primary care physician Final diagnoses:  None   diagnosis hemrrhagic cystitis      Doug SouSam Jinnifer Montejano, MD 01/09/15 [phone]

## 2015-01-09 NOTE — Discharge Instructions (Signed)
Urinary Tract Infection Call the [facility] or any of the numbers on the resource guide to get a primary care physician. Urinary tract infections (UTIs) can develop anywhere along your urinary tract. Your urinary tract is your body's drainage system for removing wastes and extra water. Your urinary tract includes two kidneys, two ureters, a bladder, and a urethra. Your kidneys are a pair of bean-shaped organs. Each kidney is about the size of your fist. They are located below your ribs, one on each side of your spine. CAUSES Infections are caused by microbes, which are microscopic organisms, including fungi, viruses, and bacteria. These organisms are so small that they can only be seen through a microscope. Bacteria are the microbes that most commonly cause UTIs. SYMPTOMS  Symptoms of UTIs may vary by age and gender of the patient and by the location of the infection. Symptoms in young women typically include a frequent and intense urge to urinate and a painful, burning feeling in the bladder or urethra during urination. Older women and men are more likely to be tired, shaky, and weak and have muscle aches and abdominal pain. A fever may mean the infection is in your kidneys. Other symptoms of a kidney infection include pain in your back or [place] below the ribs, nausea, and vomiting. DIAGNOSIS To diagnose a UTI, your caregiver will ask you about your symptoms. Your caregiver will also ask you to provide a urine sample. The urine sample will be tested for bacteria and white blood cells. White blood cells are made by your body to help fight infection. TREATMENT  Typically, UTIs can be treated with medication. Because most UTIs are caused by a bacterial infection, they usually can be treated with the use of antibiotics. The choice of antibiotic and length of treatment depend on your symptoms and the type of bacteria causing your infection. HOME CARE INSTRUCTIONS  If you were  prescribed antibiotics, take them exactly as your caregiver instructs you. Finish the medication even if you feel better after you have only taken some of the medication.  Drink enough water and fluids to keep your urine clear or pale yellow.  Avoid caffeine, tea, and carbonated beverages. They tend to irritate your bladder.  Empty your bladder often. Avoid holding urine for long periods of time.  Empty your bladder before and after sexual intercourse.  After a bowel movement, women should cleanse from front to back. Use each tissue only once. SEEK MEDICAL CARE IF:   You have back pain.  You develop a fever.  Your symptoms do not begin to resolve within 3 days. SEEK IMMEDIATE MEDICAL CARE IF:   You have severe back pain or lower abdominal pain.  You develop chills.  You have nausea or vomiting.  You have continued burning or discomfort with urination. MAKE SURE YOU:   Understand these instructions.  Will watch your condition.  Will get help right away if you are not doing well or get worse.   This information is not intended to replace advice given to you by your health care provider. Make sure you discuss any questions you have with your health care provider.   Document Released: 10/22/2004 Document Revised: 10/03/2014 Document Reviewed: 02/20/2011 Elsevier Interactive Patient Education 2016 [employer].  Emergency Department Resource Guide 1) Find a Doctor and Pay Out of Pocket Although you won't have to find out who is covered by your insurance plan, it is a good idea to ask around and get  recommendations. You will then need to call the office and see if the doctor you have chosen will accept you as a new patient and what types of options they offer for patients who are self-pay. Some doctors offer discounts or will set up payment plans for their patients who do not have insurance, but you will need to ask so you aren't surprised when you get to your appointment.  2)  Contact Your Local Health Department Not all health departments have doctors that can see patients for sick visits, but many do, so it is worth a call to see if yours does. If you don't know where your local health department is, you can check in your phone book. The CDC also has a tool to help you locate your state's health department, and many state websites also have listings of all of their local health departments.  3) Find a Walk-in Clinic If your illness is not likely to be very severe or complicated, you may want to try a walk in clinic. These are popping up all over the country in pharmacies, drugstores, and shopping centers. They're usually staffed by nurse practitioners or physician assistants that have been trained to treat common illnesses and complaints. They're usually fairly quick and inexpensive. However, if you have serious medical issues or chronic medical problems, these are probably not your best option.  No Primary Care Doctor: - Call Health Connect at  [phone] - they can help you locate a primary care doctor that  accepts your insurance, provides certain services, etc. - Physician Referral Service- [phone]  Chronic Pain Problems: Organization         Address  Phone   Notes  [facility]  [phone]) [phone] Patients need to be referred by their primary care doctor.   Medication Assistance: Organization         Address  Phone   Notes  [facility] [address] [place], [place] [address] [phone]) [phone] --Must be a resident of [place] [facility] [place] -- Must have NO insurance coverage whatsoever (no Medicaid/ Medicare, etc.) -- The pt. MUST have a primary care doctor that directs their care regularly and follows them in the community   Med[facility]ist  [phone]   [employer]  [phone]    Agencies that provide inexpensive medical care: Organization         Address  Phone   Notes  [facility]   [phone]   [facility]    [phone]   [facility] [address] [place], [place] [address] [phone]) [phone]   [facility] [address], [place] [phone]) [phone]   Planned Parenthood    [phone](336) [phone]   [facility]    [phone]   Community Health and [facility]  [address], [place] Phone:  [phone], Fax:  [phone]) [phone] Hours of Operation:  9 am - 6 pm, M-F.  Also accepts Medicaid/Medicare and self-pay.  [facility]  [address], [place] Phone: [phone]) [phone], Fax: [phone]. Hours of Operation:  8:30 am - 5:30 pm, M-F.  Also accepts Medicaid and self-pay.  [facility] [place] [address], [place] Phone: [phone]) [phone]   [facility] [address], [place], [place] [phone], Ext. [phone] Mondays & Thursdays: 7-9 AM.  First 15 patients are seen on a first  come, first serve basis.    Medicaid-accepting [facility] Providers:  Organization         Address  Phone   Notes  [facility] [address], [place] [phone] Also accepts self-pay patients.  [facility] [address] Laurell Josephs [place], [place]  [phone]   [facility] [address], [place] [phone]   [facility] [address], [place] [phone]   Renaye Rakers [address], [place]   [phone] Only accepts [place] Access [place] patients after they have their name applied to their card.   Self-Pay (no insurance) in [facility]:  Organization         Address  Phone   Notes  Sickle Cell Patients, [facility] [address] [place], [place] [phone]   [facility] [address] [place], [place] [phone]   [facility] [place]  [address], [place] [phone]   [facility]/Dr. Osei-Bonsu  [address], [place] or [address], [place] [phone] Phone number for both [place] and [place] locations is the same.  Urgent Medical and [facility] [address] [place] [phone]   [facility] [address] [place] or [address] [phone]   [facility] [address], [place] [phone], phone; [phone], fax Sees patients 1st and 3rd Saturday of every month.  Must not qualify for public or private insurance (i.e. Medicaid, Medicare, [place] Health Choice, Veterans' Benefits)  Household income should be no more than 200% of the poverty level The clinic cannot treat you if you are pregnant or think you are pregnant  Sexually transmitted diseases are not treated at the clinic.    Dental Care: Organization         Address  Phone  Notes  [facility] Department of [facility] [address] [place], [place] [phone] Accepts children up to age 93 who are enrolled in [place] or [place] Health Choice; pregnant women with a Medicaid card; and children who have applied for Medicaid or [place] Health Choice, but were declined, whose parents can pay a reduced fee at time of service.  [facility] of [facility] [place]  [address], [place] [phone] Accepts children up to age 14 who are enrolled in [place] or [place] Health Choice; pregnant women with a Medicaid card; and children who have applied for Medicaid or [place] Health Choice, but were declined, whose parents can pay a reduced fee at time of service.  [facility]  [address] [place], [place] [phone] Patients are seen by appointment only. Walk-ins are not accepted. [facility] will see patients 12 years of age and older. Monday - Tuesday  (8am-5pm) Most Wednesdays (8:30-5pm) $30 per visit, cash only  [facility] Adult Dental Access PRO[facility]  [address], [facility] [phone] Patients are seen by appointment only. Walk-ins are not accepted. [facility] will see patients 40 years of age and older. One Wednesday Evening (Monthly: Volunteer Based).  $30 per visit, cash only  [employer]  [phone] for adults; Children under age 3, call Graduate Pediatric Dentistry at [phone]. Children aged 35-14,  please call [phone] to request a pediatric application.  Dental services are provided in all areas of dental care including fillings, crowns and bridges, complete and partial dentures, implants, gum treatment, root canals, and extractions. Preventive care is also provided. Treatment is provided to both adults and children. Patients are selected via a lottery and there is often a waiting list.   [facility] [address], [place]  [phone] [email or web address]   [facility] [address] [place], [place] [phone], Ext. [phone] Second and Fourth Thursday of each month, opens at 6:30 AM; Clinic ends at 9 AM.  Patients are seen on a first-come first-served basis, and a limited number are seen during each clinic.   [facility]  [address] Ether Griffins [place], [place] [phone]   Eligibility Requirements You must have lived in [place], [place], or [place] counties for at least the last three months.   You cannot be eligible for state or federal sponsored [place], including [employer], [place], or [employer].   You generally cannot be eligible for healthcare insurance through your employer.    How to apply: Eligibility screenings are held every Tuesday and Wednesday afternoon from 1:00 pm until 4:00 pm. You do not need an appointment for the interview!  [facility] [address] [place], [place]  [phone]   [facility] Health Department  [phone]   [facility] Health Department  [phone]   [facility] Health Department  [phone]    Behavioral Health Resources in the Community: Intensive Outpatient Programs Organization         Address  Phone  Notes  [facility] Services [address] [place], [place] [phone]   [facility]patient [address] [place], [place] [phone]   [facility] [address] [place], [place]  [phone]   [facility] [address]  [place], [place] [phone] or [phone]   Substance Abuse Resources Organization         Address  Phone  Notes  Al[facility] Drug Services  [phone]   [facility]  [phone]   [facility] [place]  [phone]   Floydene Flock  [phone]   Residential & Outpatient Substance Abuse Program  [phone]   Psychological Services Organization         Address  Phone  Notes  [facility] Behavioral Health  [phone]   [facility] Services  [phone]   [facility] [address], [place] [phone] or [phone]    Mobile Crisis Teams Organization         Address  Phone  Notes  [facility], Mobile Crisis Care Unit  [phone]   [facility]  [address] [place], [place] [phone]   Doristine Locks [address] [place] [phone]    Self-Help/Support Groups Organization         Address  Phone             Notes  [facility] [place] - variety of support groups  [phone]- I7437963 Call for more information  Narcotics Anonymous (NA), [facility] [address], [employer] [place]  2 meetings at this location   Statistician         Address  Phone  Notes  [facility] [address],    [place]  [phone]   [facility]  [address] [place] [address], [place], [place] [phone]   [facility] [address] [place], [place] [phone] Admissions: 8am-3pm M-F  [facility] [address],    [place], [place] [phone]   [facility] [address] [place], [place], [place] [phone]   [facility] [address],  [place], [place] [phone]   [facility] - Intensive Outpatient [address] [place] [address], [place], [place] [phone]   [facility] ([facility] [address] [place]  [place], [place] [phone] or [phone]   [facility] ([facility]) [address], [place], [place] [phone] Accepts Medicaid  [facility] [place] [address]  [place] [phone] Substance Abuse/Addiction Treatment   [facility] Organization         Address  Phone  Notes  [facility]  [phone]   Angie Fava, PhD [address] Ervin Knack [place], [place]   [phone] or [phone]   [facility]   [address] [place], [place] [phone]   [facility] [address] [place], [place] [phone] Insurance/Medicaid/sponsorship through [facility] and Families [address]                                    [place], [place] [phone] Therapy/tele-psych/case  [facility] [address] [place], [place] [phone]    Dr. Lolly Mustache  [phone]   [facility] [place]  [employer] [facility] Dept. 1) [address] [place] 2) [address], [place] 3)  [address], [place] [phone]  [phone]   [facility] Child Abuse Hotline 3307573578 or [phone] (After Hours)

## 2015-01-09 NOTE — ED Notes (Signed)
Dysuria x 2 days-NAD-steady gait

## 2015-01-21 ENCOUNTER — Emergency Department (HOSPITAL_BASED_OUTPATIENT_CLINIC_OR_DEPARTMENT_OTHER)
Admission: EM | Admit: 2015-01-21 | Discharge: 2015-01-21 | Disposition: A | Discharge status: Home / Self Care | Payer: Self-pay | Attending: Emergency Medicine | Admitting: Emergency Medicine

## 2015-01-21 ENCOUNTER — Encounter (HOSPITAL_BASED_OUTPATIENT_CLINIC_OR_DEPARTMENT_OTHER): Payer: Self-pay | Admitting: *Deleted

## 2015-01-21 DIAGNOSIS — N921 Excessive and frequent menstruation with irregular cycle: Secondary | ICD-10-CM | POA: Insufficient documentation

## 2015-01-21 DIAGNOSIS — Z3202 Encounter for pregnancy test, result negative: Secondary | ICD-10-CM | POA: Insufficient documentation

## 2015-01-21 DIAGNOSIS — Z8679 Personal history of other diseases of the circulatory system: Secondary | ICD-10-CM | POA: Insufficient documentation

## 2015-01-21 LAB — URINE MICROSCOPIC-ADD ON

## 2015-01-21 LAB — URINALYSIS, ROUTINE W REFLEX MICROSCOPIC
Bilirubin Urine: NEGATIVE
GLUCOSE, UA: NEGATIVE mg/dL
KETONES UR: NEGATIVE mg/dL
Leukocytes, UA: NEGATIVE
Nitrite: NEGATIVE
PH: 7 (ref 5.0–8.0)
PROTEIN: NEGATIVE mg/dL
Specific Gravity, Urine: 1.025 (ref 1.005–1.030)

## 2015-01-21 LAB — WET PREP, GENITAL
SPERM: NONE SEEN
Trich, Wet Prep: NONE SEEN
Yeast Wet Prep HPF POC: NONE SEEN

## 2015-01-21 LAB — PREGNANCY, URINE: PREG TEST UR: NEGATIVE

## 2015-01-21 NOTE — Discharge Instructions (Signed)

## 2015-01-21 NOTE — ED Notes (Signed)
MD at bedside. 

## 2015-01-21 NOTE — ED Provider Notes (Signed)
CSN: [id number]     Arrival date & time 01/21/15  1144 History   First MD Initiated Contact with Patient 01/21/15 1244     Chief Complaint  Patient presents with  . Metrorrhagia     (Consider location/radiation/quality/duration/timing/severity/associated sxs/prior Treatment) Patient is a 29 y.o. female presenting with vaginal bleeding. The history is provided by the patient.  Vaginal Bleeding Quality:  Dark red Severity:  Moderate Onset quality:  Gradual Duration:  2 days Timing:  Constant Progression:  Unchanged Chronicity:  New Menstrual history:  Regular Possible pregnancy: no   Context: at rest   Relieved by:  None tried Worsened by:  Nothing tried Ineffective treatments:  None tried Associated symptoms: abdominal pain (lower cramping)   Associated symptoms: no fever and no vaginal discharge   Risk factors: no bleeding disorder and no STD exposure     Past Medical History  Diagnosis Date  . Migraine     otc med prn   Past Surgical History  Procedure Laterality Date  . Cesarean section      for breech  . Cesarean section  10/28/2010    Procedure: CESAREAN SECTION;  Surgeon: Fortino SicEleanor E Greene, MD;  Location: [facility];  Service: Gynecology;  Laterality: N/A;  REPEAT INTRAUTERINE FETAL DEMISE  . Cesarean section N/A 05/24/2012    Procedure: CESAREAN SECTION;  Surgeon: Fortino SicEleanor E Greene, MD;  Location: [facility];  Service: Obstetrics;  Laterality: N/A;   History reviewed. No pertinent family history. Social History  Substance Use Topics  . Smoking status: Never Smoker   . Smokeless tobacco: Never Used  . Alcohol Use: No   OB History    Gravida Para Term Preterm AB TAB SAB Ectopic Multiple Living   4 3 3  0 1 1 0 0 0 2     Review of Systems  Constitutional: Negative for fever.  Gastrointestinal: Positive for abdominal pain (lower cramping).  Genitourinary: Positive for vaginal bleeding. Negative for vaginal discharge.  All other systems reviewed and are  negative.     Allergies  Review of patient's allergies indicates no known allergies.  Home Medications   Prior to Admission medications   Not on File   BP 127/70 mmHg  Pulse 65  Temp(Src) 98.7 F (37.1 C)  Resp 16  Ht 5\' 4"  (1.626 m)  Wt 138 lb (62.596 kg)  BMI 23.68 kg/m2  SpO2 100%  LMP 01/05/2015 Physical Exam  Constitutional: She is oriented to person, place, and time. She appears well-developed and well-nourished. No distress.  HENT:  Head: Normocephalic.  Eyes: Conjunctivae are normal.  Neck: Neck supple. No tracheal deviation present.  Cardiovascular: Normal rate and regular rhythm.   Pulmonary/Chest: Effort normal. No respiratory distress.  Abdominal: Soft. She exhibits no distension. There is no tenderness. There is no rigidity and no guarding.  Genitourinary: Uterus is tender. Cervix exhibits no motion tenderness. Right adnexum displays no mass and no tenderness. Left adnexum displays no mass and no tenderness. There is bleeding in the vagina. No signs of injury around the vagina. No vaginal discharge found.  Neurological: She is alert and oriented to person, place, and time.  Skin: Skin is warm and dry.  Psychiatric: She has a normal mood and affect.    ED Course  Procedures (including critical care time) Labs Review Labs Reviewed  WET PREP, GENITAL - Abnormal; Notable for the following:    Clue Cells Wet Prep HPF POC PRESENT (*)    WBC, Wet Prep HPF POC MODERATE (*)  All other components within normal limits  URINALYSIS, ROUTINE W REFLEX MICROSCOPIC (NOT AT [facility]) - Abnormal; Notable for the following:    Hgb urine dipstick SMALL (*)    All other components within normal limits  URINE MICROSCOPIC-ADD ON - Abnormal; Notable for the following:    Squamous Epithelial / LPF 0-5 (*)    Bacteria, UA FEW (*)    All other components within normal limits  URINE CULTURE  PREGNANCY, URINE  GC/CHLAMYDIA PROBE AMP ([place]) NOT AT [facility]    Imaging  Review No results found. I have personally reviewed and evaluated these images and lab results as part of my medical decision-making.   EKG Interpretation None      MDM   Final diagnoses:  Menometrorrhagia   29 y.o. female presents with vaginal bleeding and lower abdominal cramping for 2 days. Not pregnant. Normally has regular periods. Last was 16 days ago. exam c/w low volume bleeding, no signs of anemia. AFVSS. No signs of persistent UTI. Discussed routine Gyn f/u.   [name], MD 01/21/15 [phone]

## 2015-01-21 NOTE — ED Notes (Signed)
Pt c/o blood in urine and lower abd pain x 2 days

## 2015-01-22 LAB — URINE CULTURE: Culture: 1000

## 2015-01-26 LAB — GC/CHLAMYDIA PROBE AMP (~~LOC~~) NOT AT ARMC
Chlamydia: NEGATIVE
Neisseria Gonorrhea: NEGATIVE

## 2015-06-27 ENCOUNTER — Encounter (HOSPITAL_BASED_OUTPATIENT_CLINIC_OR_DEPARTMENT_OTHER): Payer: Self-pay | Admitting: *Deleted

## 2015-06-27 ENCOUNTER — Emergency Department (HOSPITAL_BASED_OUTPATIENT_CLINIC_OR_DEPARTMENT_OTHER)
Admission: EM | Admit: 2015-06-27 | Discharge: 2015-06-27 | Disposition: A | Discharge status: Home / Self Care | Payer: Self-pay | Attending: Emergency Medicine | Admitting: Emergency Medicine

## 2015-06-27 DIAGNOSIS — B9689 Other specified bacterial agents as the cause of diseases classified elsewhere: Secondary | ICD-10-CM

## 2015-06-27 DIAGNOSIS — N760 Acute vaginitis: Secondary | ICD-10-CM | POA: Insufficient documentation

## 2015-06-27 LAB — URINALYSIS, ROUTINE W REFLEX MICROSCOPIC
BILIRUBIN URINE: NEGATIVE
Glucose, UA: NEGATIVE mg/dL
HGB URINE DIPSTICK: NEGATIVE
KETONES UR: 15 mg/dL — AB
Leukocytes, UA: NEGATIVE
NITRITE: NEGATIVE
PROTEIN: NEGATIVE mg/dL
SPECIFIC GRAVITY, URINE: 1.029 (ref 1.005–1.030)
pH: 6 (ref 5.0–8.0)

## 2015-06-27 LAB — WET PREP, GENITAL
Sperm: NONE SEEN
Trich, Wet Prep: NONE SEEN
Yeast Wet Prep HPF POC: NONE SEEN

## 2015-06-27 LAB — PREGNANCY, URINE: PREG TEST UR: NEGATIVE

## 2015-06-27 MED ORDER — METRONIDAZOLE 500 MG PO TABS: 500.0000 mg | ORAL_TABLET | Freq: Two times a day (BID) | ORAL | Status: DC

## 2015-06-27 MED ORDER — LIDOCAINE HCL (PF) 1 % IJ SOLN
INTRAMUSCULAR | Status: AC
  Administered 2015-06-27: 2.1 mL
  Filled 2015-06-27: qty 5

## 2015-06-27 MED ORDER — AZITHROMYCIN 250 MG PO TABS
1000.0000 mg | ORAL_TABLET | Freq: Once | ORAL | Status: AC
  Administered 2015-06-27: 1000 mg via ORAL
  Filled 2015-06-27: qty 4

## 2015-06-27 MED ORDER — CEFTRIAXONE SODIUM 250 MG IJ SOLR
250.0000 mg | Freq: Once | INTRAMUSCULAR | Status: AC
  Administered 2015-06-27: 250 mg via INTRAMUSCULAR
  Filled 2015-06-27: qty 250

## 2015-06-27 NOTE — ED Notes (Signed)
Lower abdominal pain and vaginal discharge x 3 days.

## 2015-06-27 NOTE — ED Provider Notes (Signed)
CSN: [id number]     Arrival date & time 06/27/15  1532 History   First MD Initiated Contact with Patient 06/27/15 1603     Chief Complaint  Patient presents with  . Abdominal Pain  . Vaginal Discharge    (Consider location/radiation/quality/duration/timing/severity/associated sxs/prior Treatment) Patient is a 30 y.o. female presenting with abdominal pain and vaginal discharge. The history is provided by the patient and medical records. No language interpreter was used.  Abdominal Pain Associated symptoms: vaginal discharge   Associated symptoms: no chills, no cough, no diarrhea, no dysuria, no fever, no nausea, no shortness of breath and no vomiting   Vaginal Discharge Associated symptoms: abdominal pain   Associated symptoms: no dysuria, no fever, no nausea and no vomiting    Sara Coleman is a 30 y.o. female  with no pertinent PMH who presents to the Emergency Department complaining of worsening white vaginal discharge with associated foul odor x 3 days. Associated suprapubic abdominal pain. Denies n/v, fever, back pain, dysuria, urinary frequency. No medications taken PTA for symptoms. No alleviated or aggravating factors noted.   Past Medical History  Diagnosis Date  . Migraine     otc med prn   Past Surgical History  Procedure Laterality Date  . Cesarean section      for breech  . Cesarean section  10/28/2010    Procedure: CESAREAN SECTION;  Surgeon: Fortino Sic, MD;  Location: [facility];  Service: Gynecology;  Laterality: N/A;  REPEAT INTRAUTERINE FETAL DEMISE  . Cesarean section N/A 05/24/2012    Procedure: CESAREAN SECTION;  Surgeon: Fortino Sic, MD;  Location: [facility];  Service: Obstetrics;  Laterality: N/A;   No family history on file. Social History  Substance Use Topics  . Smoking status: Never Smoker   . Smokeless tobacco: Never Used  . Alcohol Use: No   OB History    Gravida Para Term Preterm AB TAB SAB Ectopic Multiple Living   0 1 1 0 0 0 2      Review of Systems  Constitutional: Negative for fever and chills.  HENT: Negative for congestion.   Eyes: Negative for visual disturbance.  Respiratory: Negative for cough and shortness of breath.   Cardiovascular: Negative.   Gastrointestinal: Positive for abdominal pain. Negative for nausea, vomiting and diarrhea.  Genitourinary: Positive for vaginal discharge. Negative for dysuria.  Musculoskeletal: Negative for back pain and neck pain.  Skin: Negative for rash.  Neurological: Negative for headaches.      Allergies  Review of patient's allergies indicates no known allergies.  Home Medications   Prior to Admission medications   Medication Sig Start Date End Date Taking? Authorizing Provider  metroNIDAZOLE (FLAGYL) 500 MG tablet Take 1 tablet (500 mg total) by mouth 2 (two) times daily. 06/27/15   Jaime Pilcher Ward, PA-C   BP 121/71 mmHg  Pulse 107  Temp(Src) 98.9 F (37.2 C) (Oral)  Resp 20  Wt 62.596 kg  SpO2 98%  LMP 06/20/2015 Physical Exam  Constitutional: She is oriented to person, place, and time. She appears well-developed and well-nourished.  Alert and in no acute distress  HENT:  Head: Normocephalic and atraumatic.  Cardiovascular: Normal rate, regular rhythm and normal heart sounds.   Pulmonary/Chest: Effort normal and breath sounds normal. No respiratory distress.  Abdominal: Soft. Bowel sounds are normal. She exhibits no distension and no mass. There is tenderness (Mild suprapubic). There is no rebound and no guarding.  Genitourinary:  Chaperone present for  exam. + white discharge. No rashes, lesions, or tenderness to external genitalia. No erythema, injury, or tenderness to vaginal mucosa. No bleeding within vaginal vault. No adnexal masses, tenderness, or fullness. No CMT.   Neurological: She is alert and oriented to person, place, and time.  Skin: Skin is warm and dry.  Nursing note and vitals reviewed.   ED Course  Procedures (including critical  care time) Labs Review Labs Reviewed  WET PREP, GENITAL - Abnormal; Notable for the following:    Clue Cells Wet Prep HPF POC PRESENT (*)    WBC, Wet Prep HPF POC MANY (*)    All other components within normal limits  URINALYSIS, ROUTINE W REFLEX MICROSCOPIC (NOT AT [facility]) - Abnormal; Notable for the following:    Color, Urine [place] (*)    Ketones, ur 15 (*)    All other components within normal limits  PREGNANCY, URINE  GC/CHLAMYDIA PROBE AMP ([place]) NOT AT [facility]    Imaging Review No results found. I have personally reviewed and evaluated these images and lab results as part of my medical decision-making.   EKG Interpretation None      MDM   Final diagnoses:  BV (bacterial vaginosis)   Sara Coleman presents to ED for vaginal discharge and suprapubic pain. Recent unprotected intercourse with new partner. UA reassuring. Upreg negative. White discharge present on pelvic exam. No cervical motion tenderness. Patient is afebrile and appears well with nonsurgical abdomen. Wet prep shows clue cells and many white blood cells. Will treat BV with Flagyl. Offered prophylactic antibiotics for G&C which patient would like to receive here. Informed that she will be called if G&C results are positive. PCP or GYN follow-up if symptoms do not improve. Return precautions given and all questions answered.  [facility] - Los AngelesJaime Pilcher Ward, PA-C 06/27/15 1737  Geoffery Lyonsouglas Delo, MD 06/27/15 [phone]

## 2015-06-27 NOTE — Discharge Instructions (Signed)
Take Flagyl as directed-do not drink alcohol on this medication. Use a condom with every sexual encounter Follow up with your doctor or OBGYN in regards to today's visit.   Please return to the ER for worsening symptoms, high fevers or persistent vomiting.  You have been tested for chlamydia and gonorrhea. These results will be available in approximately 3 days. You will be notified if they are positive.   It is very important to practice safe sex and use condoms when sexually active. If your results are positive you need to notify all sexual partners so they can be treated as well. The website https://garcia.net/http://www.dontspreadit.com/ can be used to send anonymous text messages or emails to alert sexual contacts.   SEEK IMMEDIATE MEDICAL CARE IF:  You develop an oral temperature above 102 F (38.9 C), not controlled by medications or lasting more than 2 days.  You develop an increase in pain.   You develop vaginal bleeding and it is not time for your period.  You develop painful intercourse.

## 2015-06-27 NOTE — ED Notes (Signed)
Pt c/o pain to L suprapubic area and vaginal discharge x 3 days. Pt describes discharge as white with a foul odor. Denies N/V. No OTC meds taken.

## 2015-07-02 LAB — GC/CHLAMYDIA PROBE AMP (~~LOC~~) NOT AT ARMC
CHLAMYDIA, DNA PROBE: NEGATIVE
NEISSERIA GONORRHEA: NEGATIVE

## 2015-09-29 ENCOUNTER — Emergency Department (HOSPITAL_BASED_OUTPATIENT_CLINIC_OR_DEPARTMENT_OTHER)
Admission: EM | Admit: 2015-09-29 | Discharge: 2015-09-29 | Disposition: A | Discharge status: Home / Self Care | Payer: Managed Care, Other (non HMO) | Attending: Emergency Medicine | Admitting: Emergency Medicine

## 2015-09-29 ENCOUNTER — Encounter (HOSPITAL_BASED_OUTPATIENT_CLINIC_OR_DEPARTMENT_OTHER): Payer: Self-pay | Admitting: *Deleted

## 2015-09-29 DIAGNOSIS — N760 Acute vaginitis: Secondary | ICD-10-CM | POA: Diagnosis not present

## 2015-09-29 DIAGNOSIS — B9689 Other specified bacterial agents as the cause of diseases classified elsewhere: Secondary | ICD-10-CM

## 2015-09-29 DIAGNOSIS — N898 Other specified noninflammatory disorders of vagina: Secondary | ICD-10-CM | POA: Diagnosis present

## 2015-09-29 HISTORY — DX: Trichomoniasis, unspecified: A59.9

## 2015-09-29 LAB — URINALYSIS, ROUTINE W REFLEX MICROSCOPIC
GLUCOSE, UA: NEGATIVE mg/dL
HGB URINE DIPSTICK: NEGATIVE
KETONES UR: 15 mg/dL — AB
Nitrite: NEGATIVE
PH: 6 (ref 5.0–8.0)
PROTEIN: NEGATIVE mg/dL
Specific Gravity, Urine: 1.027 (ref 1.005–1.030)

## 2015-09-29 LAB — URINE MICROSCOPIC-ADD ON: RBC / HPF: NONE SEEN RBC/hpf (ref 0–5)

## 2015-09-29 LAB — WET PREP, GENITAL
Sperm: NONE SEEN
Trich, Wet Prep: NONE SEEN
WBC, Wet Prep HPF POC: NONE SEEN
Yeast Wet Prep HPF POC: NONE SEEN

## 2015-09-29 LAB — PREGNANCY, URINE: PREG TEST UR: NEGATIVE

## 2015-09-29 MED ORDER — METRONIDAZOLE 500 MG PO TABS: 500.0000 mg | ORAL_TABLET | Freq: Two times a day (BID) | ORAL | 0 refills | Status: DC

## 2015-09-29 NOTE — ED Provider Notes (Signed)
[facility]   CSN: [id number] Arrival date & time: 09/29/15  1030     History   Chief Complaint Chief Complaint  Patient presents with  . Vaginal Itching    HPI Sara Coleman is a 30 y.o. female.  The patient is a 30 year old female, prior history of Trichomonas approximately 2 years ago, has been with the same partner for 11 years, does not use any protection, states that she has been having 1 week of feet burning with urination associated with a fishy odor and a gray vaginal discharge and discomfort in her pelvis. No bleeding, no abdominal pain, no fevers chills nausea or vomiting. Symptoms are persistent, gradually worsening, did not improve with over-the-counter yeast infection medication. She denies any recent antibiotic use.    Vaginal Itching  Pertinent negatives include no abdominal pain.    Past Medical History:  Diagnosis Date  . Migraine    otc med prn  . Trichomonas infection 2015    Patient Active Problem List   Diagnosis Date Noted  . 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 CESAREAN SECTION     for breech  . CESAREAN SECTION  10/28/2010   Procedure: CESAREAN SECTION;  Surgeon: Fortino SicEleanor E Greene, MD;  Location: [facility];  Service: Gynecology;  Laterality: N/A;  REPEAT INTRAUTERINE FETAL DEMISE  . CESAREAN SECTION N/A 05/24/2012   Procedure: CESAREAN SECTION;  Surgeon: Fortino SicEleanor E Greene, MD;  Location: [facility];  Service: Obstetrics;  Laterality: N/A;    OB History    Gravida Para Term Preterm AB Living   4 3 3  0 1 2   SAB TAB Ectopic Multiple Live Births   0 1 0 0 1       Home Medications    Prior to Admission medications   Medication Sig Start Date End Date Taking? Authorizing Provider  metroNIDAZOLE (FLAGYL) 500 MG tablet Take 1 tablet (500 mg total) by mouth 2 (two) times daily. 09/29/15   Eber HongBrian Oronde Hallenbeck, MD    Family  History No family history on file.  Social History Social History  Substance Use Topics  . Smoking status: Never Smoker  . Smokeless tobacco: Never Used  . Alcohol use Yes     Comment: weekends     Allergies   Review of patient's allergies indicates no known allergies.   Review of Systems Review of Systems  Constitutional: Negative for fever.  Gastrointestinal: Negative for abdominal pain and nausea.  Genitourinary: Positive for dysuria, vaginal discharge and vaginal pain. Negative for hematuria and vaginal bleeding.     Physical Exam Updated Vital Signs BP 118/84 (BP Location: Right Arm)   Pulse 68   Temp 98.4 F (36.9 C) (Oral)   Resp 16   Ht 5\' 4"  (1.626 m)   Wt 130 lb (59 kg)   LMP 09/12/2015   SpO2 100%   BMI 22.31 kg/m   Physical Exam  Constitutional: She appears well-developed and well-nourished.  HENT:  Head: Normocephalic and atraumatic.  Eyes: Conjunctivae are normal. Right eye exhibits no discharge. Left eye exhibits no discharge.  Pulmonary/Chest: Effort normal. No respiratory distress.  Abdominal:  Soft nontender abdomen  Genitourinary:  Genitourinary Comments: Chaperone present for exam   Normal appearing external genitalia, normal-appearing vaginal vault with no discharge drainage blood or foreign body. Cervix examined, all is closed, no bleeding, foreign material or drainage or discharge or foul odor. No cervical  motion tenderness, no adnexal tenderness or masses.  Neurological: She is alert. Coordination normal.  Skin: Skin is warm and dry. No rash noted. She is not diaphoretic. No erythema.  Psychiatric: She has a normal mood and affect.  Nursing note and vitals reviewed.    ED Treatments / Results  Labs (all labs ordered are listed, but only abnormal results are displayed) Labs Reviewed  WET PREP, GENITAL - Abnormal; Notable for the following:       Result Value   Clue Cells Wet Prep HPF POC PRESENT (*)    All other components within  normal limits  URINALYSIS, ROUTINE W REFLEX MICROSCOPIC (NOT AT [facility]) - Abnormal; Notable for the following:    Color, Urine [place] (*)    APPearance CLOUDY (*)    Bilirubin Urine SMALL (*)    Ketones, ur 15 (*)    Leukocytes, UA SMALL (*)    All other components within normal limits  URINE MICROSCOPIC-ADD ON - Abnormal; Notable for the following:    Squamous Epithelial / LPF 0-5 (*)    Bacteria, UA MANY (*)    Casts HYALINE CASTS (*)    All other components within normal limits  URINE CULTURE  PREGNANCY, URINE  GC/CHLAMYDIA PROBE AMP ([place]) NOT AT [facility]    EKG  EKG Interpretation None       Radiology No results found.  Procedures Procedures (including critical care time)  Medications Ordered in ED Medications - No data to display   Initial Impression / Assessment and Plan / ED Course  I have reviewed the triage vital signs and the nursing notes.  Pertinent labs & imaging results that were available during my care of the patient were reviewed by me and considered in my medical decision making (see chart for details).  Clinical Course    BV present, no other signs clinically of STD - pt informed of f/u methods if turns positive on lab testing.  UA with bacteria but no hard signs of blood counts / nitrates.  Will culture as well.  Pt expressed understanding.  Final Clinical Impressions(s) / ED Diagnoses   Final diagnoses:  BV (bacterial vaginosis)    New Prescriptions New Prescriptions   METRONIDAZOLE (FLAGYL) 500 MG TABLET    Take 1 tablet (500 mg total) by mouth 2 (two) times daily.     Eber Hong, MD 09/29/15 [phone]

## 2015-09-29 NOTE — ED Triage Notes (Signed)
Pt reports vaginal itching and abnormal vaginal discharge x1wk. Denies fever, n/v/d, abd pain. Denies known exposure to STDs (endorses unprotected sex). Reports dysuria; denies urgency, frequency, hematuria.

## 2015-09-29 NOTE — Discharge Instructions (Signed)
If you have gonorrhea, chlamydia or a urinary infection that needs treatment, you will have a prescription called in for you in the next 48 hours - your testing today shows that you have a bacterial infection.  This should get better with Flagyl.  Please obtain all of your results from medical records or have your doctors office obtain the results - share them with your doctor - you should be seen at your doctors office in the next 2 days. Call today to arrange your follow up. Take the medications as prescribed. Please review all of the medicines and only take them if you do not have an allergy to them. Please be aware that if you are taking birth control pills, taking other prescriptions, ESPECIALLY ANTIBIOTICS may make the birth control ineffective - if this is the case, either do not engage in sexual activity or use alternative methods of birth control such as condoms until you have finished the medicine and your family doctor says it is OK to restart them. If you are on a blood thinner such as COUMADIN, be aware that any other medicine that you take may cause the coumadin to either work too much, or not enough - you should have your coumadin level rechecked in next 7 days if this is the case.  ?  It is also a possibility that you have an allergic reaction to any of the medicines that you have been prescribed - Everybody reacts differently to medications and while MOST people have no trouble with most medicines, you may have a reaction such as nausea, vomiting, rash, swelling, shortness of breath. If this is the case, please stop taking the medicine immediately and contact your physician.  ?  You should return to the ER if you develop severe or worsening symptoms.

## 2015-09-30 LAB — URINE CULTURE: CULTURE: NO GROWTH

## 2015-10-01 LAB — CERVICOVAGINAL ANCILLARY ONLY
CHLAMYDIA, DNA PROBE: NEGATIVE
NEISSERIA GONORRHEA: NEGATIVE

## 2015-11-15 ENCOUNTER — Encounter (HOSPITAL_BASED_OUTPATIENT_CLINIC_OR_DEPARTMENT_OTHER): Payer: Self-pay | Admitting: *Deleted

## 2015-11-15 ENCOUNTER — Emergency Department (HOSPITAL_BASED_OUTPATIENT_CLINIC_OR_DEPARTMENT_OTHER)
Admission: EM | Admit: 2015-11-15 | Discharge: 2015-11-15 | Disposition: A | Discharge status: Home / Self Care | Payer: Managed Care, Other (non HMO) | Attending: Emergency Medicine | Admitting: Emergency Medicine

## 2015-11-15 DIAGNOSIS — Z202 Contact with and (suspected) exposure to infections with a predominantly sexual mode of transmission: Secondary | ICD-10-CM | POA: Insufficient documentation

## 2015-11-15 DIAGNOSIS — N390 Urinary tract infection, site not specified: Secondary | ICD-10-CM | POA: Diagnosis not present

## 2015-11-15 DIAGNOSIS — A599 Trichomoniasis, unspecified: Secondary | ICD-10-CM

## 2015-11-15 DIAGNOSIS — Z711 Person with feared health complaint in whom no diagnosis is made: Secondary | ICD-10-CM

## 2015-11-15 DIAGNOSIS — R102 Pelvic and perineal pain: Secondary | ICD-10-CM | POA: Diagnosis present

## 2015-11-15 LAB — WET PREP, GENITAL
Clue Cells Wet Prep HPF POC: NONE SEEN
SPERM: NONE SEEN
Trich, Wet Prep: NONE SEEN
YEAST WET PREP: NONE SEEN

## 2015-11-15 LAB — URINALYSIS, ROUTINE W REFLEX MICROSCOPIC
Glucose, UA: NEGATIVE mg/dL
KETONES UR: NEGATIVE mg/dL
NITRITE: POSITIVE — AB
Protein, ur: 100 mg/dL — AB
Specific Gravity, Urine: 1.021 (ref 1.005–1.030)
pH: 6.5 (ref 5.0–8.0)

## 2015-11-15 LAB — URINE MICROSCOPIC-ADD ON

## 2015-11-15 LAB — PREGNANCY, URINE: PREG TEST UR: NEGATIVE

## 2015-11-15 MED ORDER — METRONIDAZOLE 500 MG PO TABS
2000.0000 mg | ORAL_TABLET | Freq: Once | ORAL | Status: AC
  Administered 2015-11-15: 2000 mg via ORAL
  Filled 2015-11-15: qty 4

## 2015-11-15 MED ORDER — CEFTRIAXONE SODIUM 250 MG IJ SOLR
250.0000 mg | Freq: Once | INTRAMUSCULAR | Status: AC
  Administered 2015-11-15: 250 mg via INTRAMUSCULAR
  Filled 2015-11-15: qty 250

## 2015-11-15 MED ORDER — CEPHALEXIN 500 MG PO CAPS: 500.0000 mg | ORAL_CAPSULE | Freq: Two times a day (BID) | ORAL | 0 refills | Status: DC

## 2015-11-15 MED ORDER — CEPHALEXIN 500 MG PO CAPS: 500.0000 mg | ORAL_CAPSULE | Freq: Two times a day (BID) | ORAL | 0 refills | Status: AC

## 2015-11-15 MED ORDER — LIDOCAINE HCL (PF) 1 % IJ SOLN
INTRAMUSCULAR | Status: AC
  Administered 2015-11-15: 5 mL
  Filled 2015-11-15: qty 5

## 2015-11-15 MED ORDER — AZITHROMYCIN 250 MG PO TABS
1000.0000 mg | ORAL_TABLET | Freq: Once | ORAL | Status: AC
  Administered 2015-11-15: 1000 mg via ORAL
  Filled 2015-11-15: qty 4

## 2015-11-15 MED FILL — CEPHALEXIN 500 MG CAPSULE: 500 | 7 days supply | Qty: 14 | Fill #0

## 2015-11-15 NOTE — ED Notes (Signed)
Pt. Reports no discharge.  Pt. Reports pain in the lower pelvis with no burning with urination and feeling like she still needs to urinate after she urinates.

## 2015-11-15 NOTE — ED Provider Notes (Signed)
[facility]   CSN: [id number] Arrival date & time: 11/15/15  1331     History   Chief Complaint Chief Complaint  Patient presents with  . Abdominal Pain    HPI Sara Coleman is a 29 y.o. female.  HPI Sara Coleman is a 30 y.o. female with PMH significant for Migraine who presents with 2 day history of gradual onset, constant, unchanging dysuria with associated suprapubic pressure.  She denies any fever, chills, nausea, vomiting, flank pain. She further denies any vaginal discharge or vaginal bleeding. She denies any new sexual partners. She denies any abdominal pain. She states she took Azo the past 2 days without relief.  Past Medical History:  Diagnosis Date  . Migraine    otc med prn  . Trichomonas infection 2015    Patient Active Problem List   Diagnosis Date Noted  . 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 CESAREAN SECTION     for breech  . CESAREAN SECTION  10/28/2010   Procedure: CESAREAN SECTION;  Surgeon: Fortino Sic, MD;  Location: [facility];  Service: Gynecology;  Laterality: N/A;  REPEAT INTRAUTERINE FETAL DEMISE  . CESAREAN SECTION N/A 05/24/2012   Procedure: CESAREAN SECTION;  Surgeon: Fortino Sic, MD;  Location: [facility];  Service: Obstetrics;  Laterality: N/A;    OB History    Gravida Para Term Preterm AB Living   4 3 3  0 1 2   SAB TAB Ectopic Multiple Live Births   0 1 0 0 1       Home Medications    Prior to Admission medications   Medication Sig Start Date End Date Taking? Authorizing Provider  cephALEXin (KEFLEX) 500 MG capsule Take 1 capsule (500 mg total) by mouth 2 (two) times daily. 11/15/15 11/22/15  Cheri Fowler, PA-C  metroNIDAZOLE (FLAGYL) 500 MG tablet Take 1 tablet (500 mg total) by mouth 2 (two) times daily. 09/29/15   Eber Hong, MD    Family History No family history on file.  Social  History Social History  Substance Use Topics  . Smoking status: Never Smoker  . Smokeless tobacco: Never Used  . Alcohol use Yes     Comment: weekends     Allergies   Review of patient's allergies indicates no known allergies.   Review of Systems Review of Systems All other systems negative unless otherwise stated in HPI   Physical Exam Updated Vital Signs BP 129/84 (BP Location: Right Arm)   Pulse 62   Temp 99 F (37.2 C) (Oral)   Resp 18   Ht 5\' 4"  (1.626 m)   Wt 63.5 kg   LMP 11/03/2015   SpO2 100%   BMI 24.03 kg/m   Physical Exam  Constitutional: She is oriented to person, place, and time. She appears well-developed and well-nourished.  Non-toxic appearance. She does not have a sickly appearance. She does not appear ill.  HENT:  Head: Normocephalic and atraumatic.  Mouth/Throat: Oropharynx is clear and moist.  Eyes: Conjunctivae are normal.  Neck: Normal range of motion. Neck supple.  Cardiovascular: Normal rate and regular rhythm.   Pulmonary/Chest: Effort normal and breath sounds normal. No accessory muscle usage or stridor. No respiratory distress. She has no wheezes. She has no rhonchi. She has no rales.  Abdominal: Soft. Bowel sounds are normal. She exhibits no distension. There is no tenderness. There is no rebound and no  guarding.  No CVA tenderness.   Genitourinary: Vagina normal. There is no tenderness or lesion on the right labia. There is no tenderness or lesion on the left labia. Uterus is not tender. Cervix exhibits discharge (copious yellow-green). Cervix exhibits no motion tenderness. Right adnexum displays no tenderness. Left adnexum displays no tenderness.  Genitourinary Comments: Chaperone present during exam.   Musculoskeletal: Normal range of motion.  Lymphadenopathy:    She has no cervical adenopathy.  Neurological: She is alert and oriented to person, place, and time.  Speech clear without dysarthria.  Skin: Skin is warm and dry.   Psychiatric: She has a normal mood and affect. Her behavior is normal.     ED Treatments / Results  Labs (all labs ordered are listed, but only abnormal results are displayed) Labs Reviewed  WET PREP, GENITAL - Abnormal; Notable for the following:       Result Value   WBC, Wet Prep HPF POC MANY (*)    All other components within normal limits  URINALYSIS, ROUTINE W REFLEX MICROSCOPIC (NOT AT [facility]) - Abnormal; Notable for the following:    Color, Urine ORANGE (*)    APPearance TURBID (*)    Hgb urine dipstick MODERATE (*)    Bilirubin Urine SMALL (*)    Protein, ur 100 (*)    Nitrite POSITIVE (*)    Leukocytes, UA LARGE (*)    All other components within normal limits  URINE MICROSCOPIC-ADD ON - Abnormal; Notable for the following:    Squamous Epithelial / LPF 6-30 (*)    Bacteria, UA MANY (*)    All other components within normal limits  URINE CULTURE  PREGNANCY, URINE  RPR  HIV ANTIBODY (ROUTINE TESTING)  GC/CHLAMYDIA PROBE AMP ([place]) NOT AT [facility]    EKG  EKG Interpretation None       Radiology No results found.  Procedures Procedures (including critical care time)  Medications Ordered in ED Medications  cefTRIAXone (ROCEPHIN) injection 250 mg (not administered)  azithromycin (ZITHROMAX) tablet 1,000 mg (not administered)  metroNIDAZOLE (FLAGYL) tablet 2,000 mg (2,000 mg Oral Given 11/15/15 1515)     Initial Impression / Assessment and Plan / ED Course  I have reviewed the triage vital signs and the nursing notes.  Pertinent labs & imaging results that were available during my care of the patient were reviewed by me and considered in my medical decision making (see chart for details).  Clinical Course   Patient presents with dysuria x 2 days.  No fever, chills, N/V, abdominal pain, or flank pain.  Has been taking Azo.  Patient appears well, non-toxic or ill.  No CVA tenderness.  Abdomen soft and benign without rebound, guarding, or rigidity.   Pelvic exam revealed copious yellow-green discharge.  No adnexal tenderness or CMT tenderness.  Do not suspect PID or TOA.  She is not pregnant.  Hx not c/w ovarian torsion.  UA obtained in triage is nitrite positive, large leukocyte with TNTC WBCs and many bacteria.  Nitrite positive could be from azo, but likely infection given WBCs. Will treat with Keflex. Also Trichomonas.  Wet prep shows no trich; however, will treat with urine findings.  Many WBCs. Treated in ED with 2g Flagyl and empirically treated with rocephin and azithromycin.  Follow up health department.  Return precautions discussed.  Stable for discharge.  Final Clinical Impressions(s) / ED Diagnoses   Final diagnoses:  Urinary tract infection without hematuria, site unspecified  Trichomoniasis  Concern about STD in  female without diagnosis    New Prescriptions New Prescriptions   CEPHALEXIN (KEFLEX) 500 MG CAPSULE    Take 1 capsule (500 mg total) by mouth 2 (two) times daily.     Cheri Fowler, PA-C 11/15/15 1643    Loren Racer, MD 11/15/15 [phone]

## 2015-11-15 NOTE — ED Triage Notes (Signed)
Urinary frequency. Lower abdominal pain.

## 2015-11-16 LAB — RPR: RPR: NONREACTIVE

## 2015-11-16 LAB — HIV ANTIBODY (ROUTINE TESTING W REFLEX): HIV Screen 4th Generation wRfx: NONREACTIVE

## 2015-11-18 LAB — GC/CHLAMYDIA PROBE AMP (~~LOC~~) NOT AT ARMC
Chlamydia: NEGATIVE
Neisseria Gonorrhea: NEGATIVE

## 2015-11-18 LAB — URINE CULTURE

## 2015-11-19 ENCOUNTER — Telehealth (HOSPITAL_BASED_OUTPATIENT_CLINIC_OR_DEPARTMENT_OTHER): Payer: Self-pay | Admitting: Emergency Medicine

## 2015-11-19 NOTE — Telephone Encounter (Signed)
Post ED Visit - Positive Culture Follow-up  Culture report reviewed by antimicrobial stewardship pharmacist:  []  Enzo BiNathan Batchelder, Pharm.D. []  Celedonio MiyamotoJeremy Frens, Pharm.D., BCPS []  Garvin FilaMike Maccia, Pharm.D. []  Georgina PillionElizabeth Martin, Pharm.D., BCPS []  Mountain PineMinh Pham, [address] BoulevardPharm.D., BCPS, AAHIVP []  Estella HuskMichelle Turner, Pharm.D., BCPS, AAHIVP []  Tennis Mustassie Stewart, Pharm.D. []  Sherle Poeob Vincent, [place]D. Renee Ackler PharmD  Positive urine culture Treated with cephalexin, organism sensitive to the same and no further patient follow-up is required at this time.  Berle MullMiller, Dalyn Kjos 11/19/2015, 10:04 AM

## 2015-11-19 NOTE — Progress Notes (Signed)
ED Antimicrobial Stewardship Positive Culture Follow Up   Sara Coleman is an 30 y.o. female who [facility] with a chief complaint of  Chief Complaint  Patient presents with  . Abdominal Pain    Recent Results (from the past 720 hour(s))  Wet prep, genital     Status: Abnormal   Collection Time: 11/15/15  4:00 PM  Result Value Ref Range Status   Yeast Wet Prep HPF POC NONE SEEN NONE SEEN Final   Trich, Wet Prep NONE SEEN NONE SEEN Final   Clue Cells Wet Prep HPF POC NONE SEEN NONE SEEN Final   WBC, Wet Prep HPF POC MANY (A) NONE SEEN Final   Sperm NONE SEEN  Final  Urine culture     Status: Abnormal   Collection Time: 11/15/15  5:20 PM  Result Value Ref Range Status   Specimen Description URINE, RANDOM  Final   Special Requests NONE  Final   Culture (A)  Final    >=100,000 COLONIES/mL PROTEUS PENNERI 30,000 COLONIES/mL ESCHERICHIA COLI    Report Status 11/18/2015 FINAL  Final   Organism ID, Bacteria PROTEUS PENNERI (A)  Final   Organism ID, Bacteria ESCHERICHIA COLI (A)  Final      Susceptibility   Escherichia coli - MIC*    AMPICILLIN <=2 SENSITIVE Sensitive     CEFAZOLIN <=4 SENSITIVE Sensitive     CEFTRIAXONE <=1 SENSITIVE Sensitive     CIPROFLOXACIN <=0.25 SENSITIVE Sensitive     GENTAMICIN <=1 SENSITIVE Sensitive     IMIPENEM <=0.25 SENSITIVE Sensitive     NITROFURANTOIN <=16 SENSITIVE Sensitive     TRIMETH/SULFA <=20 SENSITIVE Sensitive     AMPICILLIN/SULBACTAM <=2 SENSITIVE Sensitive     PIP/TAZO <=4 SENSITIVE Sensitive     Extended ESBL NEGATIVE Sensitive     * 30,000 COLONIES/mL ESCHERICHIA COLI   Proteus penneri - MIC*    AMPICILLIN >=32 RESISTANT Resistant     CEFAZOLIN >=64 RESISTANT Resistant     CEFTRIAXONE <=1 SENSITIVE Sensitive     CIPROFLOXACIN <=0.25 SENSITIVE Sensitive     GENTAMICIN <=1 SENSITIVE Sensitive     IMIPENEM 2 SENSITIVE Sensitive     NITROFURANTOIN 128 RESISTANT Resistant     TRIMETH/SULFA <=20 SENSITIVE  Sensitive     AMPICILLIN/SULBACTAM 16 INTERMEDIATE Intermediate     PIP/TAZO <=4 SENSITIVE Sensitive     * >=100,000 COLONIES/mL PROTEUS PENNERI    [x]  Treated withcephalexin, organism resistant to prescribed antimicrobial  Plan: call pt to see if still having urinary sx. If yes- stop cephalexin and start Bactrim DS PO BID for 3 days. If no, continue cephalexin.  ED Provider:Jaime Ward, PA-C  Sara Coleman, PharmD PGY1 Pharmacy Resident Pager: [phone] 11/19/2015 8:52 AM

## 2015-12-04 ENCOUNTER — Encounter (HOSPITAL_BASED_OUTPATIENT_CLINIC_OR_DEPARTMENT_OTHER): Payer: Self-pay

## 2015-12-04 ENCOUNTER — Emergency Department (HOSPITAL_BASED_OUTPATIENT_CLINIC_OR_DEPARTMENT_OTHER)
Admission: EM | Admit: 2015-12-04 | Discharge: 2015-12-04 | Disposition: A | Discharge status: Home / Self Care | Payer: Managed Care, Other (non HMO) | Attending: Emergency Medicine | Admitting: Emergency Medicine

## 2015-12-04 DIAGNOSIS — N3000 Acute cystitis without hematuria: Secondary | ICD-10-CM | POA: Diagnosis not present

## 2015-12-04 DIAGNOSIS — R300 Dysuria: Secondary | ICD-10-CM | POA: Diagnosis present

## 2015-12-04 HISTORY — DX: Acute vaginitis: N76.0

## 2015-12-04 HISTORY — DX: Other specified bacterial agents as the cause of diseases classified elsewhere: B96.89

## 2015-12-04 LAB — URINALYSIS, ROUTINE W REFLEX MICROSCOPIC
BILIRUBIN URINE: NEGATIVE
Glucose, UA: NEGATIVE mg/dL
Hgb urine dipstick: NEGATIVE
KETONES UR: 15 mg/dL — AB
NITRITE: NEGATIVE
PROTEIN: 30 mg/dL — AB
Specific Gravity, Urine: 1.03 (ref 1.005–1.030)
pH: 6.5 (ref 5.0–8.0)

## 2015-12-04 LAB — PREGNANCY, URINE: Preg Test, Ur: NEGATIVE

## 2015-12-04 LAB — URINE MICROSCOPIC-ADD ON

## 2015-12-04 MED ORDER — CEPHALEXIN 500 MG PO CAPS: 500.0000 mg | ORAL_CAPSULE | Freq: Four times a day (QID) | ORAL | 0 refills | Status: DC

## 2015-12-04 MED ORDER — PHENAZOPYRIDINE HCL 200 MG PO TABS: 200.0000 mg | ORAL_TABLET | Freq: Three times a day (TID) | ORAL | 0 refills | Status: AC

## 2015-12-04 NOTE — ED Provider Notes (Signed)
[facility] Provider Note   CSN: [id number] Arrival date & time: 12/04/15  2126     History   Chief Complaint Chief Complaint  Patient presents with  . Dysuria    HPI Ryen Leigh AuroraM Santellan is a 30 y.o. female.  Patient is a 30 year old female with no significant past medical history. She presents for evaluation of burning with urination for the past 2 days. She denies any fevers or chills. She denies any abdominal pain or vaginal discharge.   The history is provided by the patient.  Dysuria   This is a new problem. The current episode started 2 days ago. The problem occurs every urination. The problem has been gradually worsening. The quality of the pain is described as burning. The pain is moderate. There has been no fever. Pertinent negatives include no chills and no discharge.    Past Medical History:  Diagnosis Date  . BV (bacterial vaginosis)   . Migraine    otc med prn  . Trichomonas infection 2015    Patient Active Problem List   Diagnosis Date Noted  . 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 CESAREAN SECTION     for breech  . CESAREAN SECTION  10/28/2010   Procedure: CESAREAN SECTION;  Surgeon: Fortino SicEleanor E Greene, MD;  Location: [facility];  Service: Gynecology;  Laterality: N/A;  REPEAT INTRAUTERINE FETAL DEMISE  . CESAREAN SECTION N/A 05/24/2012   Procedure: CESAREAN SECTION;  Surgeon: Fortino SicEleanor E Greene, MD;  Location: [facility];  Service: Obstetrics;  Laterality: N/A;    OB History    Gravida Para Term Preterm AB Living   4 3 3  0 1 2   SAB TAB Ectopic Multiple Live Births   0 1 0 0 1       Home Medications    Prior to Admission medications   Not on File    Family History No family history on file.  Social History Social History  Substance Use Topics  . Smoking status: Never Smoker  . Smokeless tobacco: Never Used  . Alcohol use Yes     Comment:  weekly     Allergies   Patient has no known allergies.   Review of Systems Review of Systems  Constitutional: Negative for chills.  Genitourinary: Positive for dysuria.  All other systems reviewed and are negative.    Physical Exam Updated Vital Signs BP 131/76 (BP Location: Left Arm)   Pulse 71   Temp 97.5 F (36.4 C) (Oral)   Resp 16   Ht 5\' 4"  (1.626 m)   Wt 140 lb (63.5 kg)   LMP 11/03/2015   SpO2 100%   BMI 24.03 kg/m   Physical Exam  Constitutional: She is oriented to person, place, and time. She appears well-developed and well-nourished. No distress.  HENT:  Head: Normocephalic and atraumatic.  Neck: Normal range of motion. Neck supple.  Cardiovascular: Normal rate and regular rhythm.  Exam reveals no gallop and no friction rub.   No murmur heard. Pulmonary/Chest: Effort normal and breath sounds normal. No respiratory distress. She has no wheezes.  Abdominal: Soft. Bowel sounds are normal. She exhibits no distension. There is no tenderness.  Musculoskeletal: Normal range of motion.  Neurological: She is alert and oriented to person, place, and time.  Skin: Skin is warm and dry. She is not diaphoretic.  Nursing note and vitals reviewed.    ED Treatments / Results  Labs (all labs ordered are listed, but only abnormal results are displayed) Labs Reviewed  URINALYSIS, ROUTINE W REFLEX MICROSCOPIC (NOT AT [facility]) - Abnormal; Notable for the following:       Result Value   APPearance CLOUDY (*)    Ketones, ur 15 (*)    Protein, ur 30 (*)    Leukocytes, UA LARGE (*)    All other components within normal limits  URINE MICROSCOPIC-ADD ON - Abnormal; Notable for the following:    Squamous Epithelial / LPF 0-5 (*)    Bacteria, UA MANY (*)    All other components within normal limits  PREGNANCY, URINE    EKG  EKG Interpretation None       Radiology No results found.  Procedures Procedures (including critical care time)  Medications Ordered in  ED Medications - No data to display   Initial Impression / Assessment and Plan / ED Course  I have reviewed the triage vital signs and the nursing notes.  Pertinent labs & imaging results that were available during my care of the patient were reviewed by me and considered in my medical decision making (see chart for details).  Clinical Course     UA consistent with a urinary tract infection. She will be treated with Keflex and Pyridium. To return as needed for any problems.  Final Clinical Impressions(s) / ED Diagnoses   Final diagnoses:  None    New Prescriptions New Prescriptions   No medications on file     Geoffery Lyonsouglas Chelesa Weingartner, MD 12/04/15 2235

## 2015-12-04 NOTE — ED Triage Notes (Signed)
C/o dysuria x 2 days-vaginal d/c x today-NAD-steady gait

## 2015-12-04 NOTE — Discharge Instructions (Signed)
Keflex and Pyridium as prescribed.  Return to the emergency department if you develop abdominal pain, high fevers, or other new and concerning symptoms.

## 2015-12-04 NOTE — ED Notes (Signed)
ED Provider at bedside. 

## 2016-07-25 ENCOUNTER — Encounter (HOSPITAL_BASED_OUTPATIENT_CLINIC_OR_DEPARTMENT_OTHER): Payer: Self-pay | Admitting: Emergency Medicine

## 2016-07-25 ENCOUNTER — Emergency Department (HOSPITAL_BASED_OUTPATIENT_CLINIC_OR_DEPARTMENT_OTHER)
Admission: EM | Admit: 2016-07-25 | Discharge: 2016-07-25 | Disposition: A | Discharge status: Home / Self Care | Payer: Managed Care, Other (non HMO) | Attending: Emergency Medicine | Admitting: Emergency Medicine

## 2016-07-25 DIAGNOSIS — N390 Urinary tract infection, site not specified: Secondary | ICD-10-CM

## 2016-07-25 DIAGNOSIS — Z79899 Other long term (current) drug therapy: Secondary | ICD-10-CM | POA: Insufficient documentation

## 2016-07-25 LAB — URINALYSIS, ROUTINE W REFLEX MICROSCOPIC
BILIRUBIN URINE: NEGATIVE
Glucose, UA: NEGATIVE mg/dL
KETONES UR: NEGATIVE mg/dL
NITRITE: NEGATIVE
Protein, ur: NEGATIVE mg/dL
Specific Gravity, Urine: 1.003 — ABNORMAL LOW (ref 1.005–1.030)
pH: 7 (ref 5.0–8.0)

## 2016-07-25 LAB — URINALYSIS, MICROSCOPIC (REFLEX)

## 2016-07-25 MED ORDER — CEPHALEXIN 250 MG PO CAPS
500.0000 mg | ORAL_CAPSULE | Freq: Once | ORAL | Status: AC
  Administered 2016-07-25: 500 mg via ORAL
  Filled 2016-07-25: qty 2

## 2016-07-25 MED ORDER — CEPHALEXIN 500 MG PO CAPS: 500.0000 mg | ORAL_CAPSULE | Freq: Two times a day (BID) | ORAL | 0 refills | Status: AC

## 2016-07-25 NOTE — ED Provider Notes (Signed)
[facility]   CSN: [id number] Arrival date & time: 07/25/16  1407     History   Chief Complaint Chief Complaint  Patient presents with  . Dysuria    HPI Sara Coleman is a 31 y.o. female.  HPI 31 y.o. female, presents to the Emergency Department today due to dysuria and urinary frequency this AM. No N/V/D. Notes suprapubic discomfort. No vaginal bleeding/discharge. Rates cramping 2/10. No back pain. No CP/SOB. No meds PTA. No other symptoms noted.   Past Medical History:  Diagnosis Date  . BV (bacterial vaginosis)   . Migraine    otc med prn  . Trichomonas infection 2015    Patient Active Problem List   Diagnosis Date Noted  . 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 CESAREAN SECTION     for breech  . CESAREAN SECTION  10/28/2010   Procedure: CESAREAN SECTION;  Surgeon: Fortino SicEleanor E Greene, MD;  Location: [facility];  Service: Gynecology;  Laterality: N/A;  REPEAT INTRAUTERINE FETAL DEMISE  . CESAREAN SECTION N/A 05/24/2012   Procedure: CESAREAN SECTION;  Surgeon: Fortino SicEleanor E Greene, MD;  Location: [facility];  Service: Obstetrics;  Laterality: N/A;    OB History    Gravida Para Term Preterm AB Living   4 3 3  0 1 2   SAB TAB Ectopic Multiple Live Births   0 1 0 0 1       Home Medications    Prior to Admission medications   Medication Sig Start Date End Date Taking? Authorizing Provider  cephALEXin (KEFLEX) 500 MG capsule Take 1 capsule (500 mg total) by mouth 4 (four) times daily. 12/04/15   Geoffery Lyonselo, Douglas, MD  phenazopyridine (PYRIDIUM) 200 MG tablet Take 1 tablet (200 mg total) by mouth 3 (three) times daily. 12/04/15   Geoffery Lyonselo, Douglas, MD    Family History No family history on file.  Social History Social History  Substance Use Topics  . Smoking status: Never Smoker  . Smokeless tobacco: Never Used  . Alcohol use Yes     Comment: weekly      Allergies   Patient has no known allergies.   Review of Systems Review of Systems  Constitutional: Negative for fever.  Endocrine: Positive for polyuria.  Genitourinary: Positive for dysuria. Negative for flank pain.  Musculoskeletal: Negative for back pain.     Physical Exam Updated Vital Signs BP 125/79 (BP Location: Left Arm)   Pulse 76   Temp 98.9 F (37.2 C) (Oral)   Resp 18   Ht 5\' 5"  (1.651 m)   Wt 59 kg (130 lb)   LMP 07/13/2016 (Exact Date)   SpO2 100%   BMI 21.63 kg/m   Physical Exam  Constitutional: She is oriented to person, place, and time. Vital signs are normal. She appears well-developed and well-nourished.  HENT:  Head: Normocephalic.  Right Ear: Hearing normal.  Left Ear: Hearing normal.  Eyes: Conjunctivae and EOM are normal. Pupils are equal, round, and reactive to light.  Cardiovascular: Normal rate and regular rhythm.   Pulmonary/Chest: Effort normal.  Abdominal: Soft. There is no tenderness. There is no rigidity, no rebound, no guarding, no CVA tenderness, no tenderness at McBurney's point and negative Murphy's sign.  Abdomen soft. No CVA  Neurological: She is alert and oriented to person, place, and time.  Skin: Skin is warm and dry.  Psychiatric: She has a normal mood and  affect. Her speech is normal and behavior is normal. Thought content normal.  Nursing note and vitals reviewed.    ED Treatments / Results  Labs (all labs ordered are listed, but only abnormal results are displayed) Labs Reviewed  URINALYSIS, ROUTINE W REFLEX MICROSCOPIC - Abnormal; Notable for the following:       Result Value   APPearance CLOUDY (*)    Specific Gravity, Urine 1.003 (*)    Hgb urine dipstick LARGE (*)    Leukocytes, UA LARGE (*)    All other components within normal limits  URINALYSIS, MICROSCOPIC (REFLEX) - Abnormal; Notable for the following:    Bacteria, UA MANY (*)    Squamous Epithelial / LPF 0-5 (*)    All other components within  normal limits  URINE CULTURE    EKG  EKG Interpretation None       Radiology No results found.  Procedures Procedures (including critical care time)  Medications Ordered in ED Medications - No data to display   Initial Impression / Assessment and Plan / ED Course  I have reviewed the triage vital signs and the nursing notes.  Pertinent labs & imaging results that were available during my care of the patient were reviewed by me and considered in my medical decision making (see chart for details).  Final Clinical Impressions(s) / ED Diagnoses  {I have reviewed and evaluated the relevant laboratory values.   {I have reviewed the relevant previous healthcare records.   ED Course:  Assessment: Pt is a 30yF with dysuria this AM. No fevers. Pt has been diagnosed with a UTI based on UA. On exam, pt in NAD. VSS. Normotensive. Afebrile. Lungs CTA, Heart RRR, no CVA tenderness, and denies N/V. Pt to be dc home with antibiotics and instructions to follow up with PCP if symptoms persist.  Disposition/Plan:  DC Home Additional Verbal discharge instructions given and discussed with patient.  Pt Instructed to f/u with PCP in the next week for evaluation and treatment of symptoms. Return precautions given Pt acknowledges and agrees with plan  Supervising Physician Tilden Fossa, MD  Final diagnoses:  Urinary tract infection without hematuria, site unspecified    New Prescriptions New Prescriptions   No medications on file     Audry Pili, Cordelia Poche 07/25/16 1537    Tilden Fossa, MD 08/04/16 1700

## 2016-07-25 NOTE — Discharge Instructions (Signed)
Please read and follow all provided instructions.  Your diagnoses today include:  1. Urinary tract infection without hematuria, site unspecified    Tests performed today include: Urine test - suggests that you have an infection in your bladder Vital signs. See below for your results today.   Medications prescribed:  Take as prescribed   Home care instructions:  Follow any educational materials contained in this packet.  Follow-up instructions: Please follow-up with your primary care provider in 3 days if symptoms are not resolved for further evaluation of your symptoms.  Return instructions:  Please return to the Emergency Department if you experience worsening symptoms.  Return with fever, worsening pain, persistent vomiting, worsening pain in your back.  Please return if you have any other emergent concerns.  Additional Information:  Your vital signs today were: BP 125/79 (BP Location: Left Arm)    Pulse 76    Temp 98.9 F (37.2 C) (Oral)    Resp 18    Ht 5\' 5"  (1.651 m)    Wt 59 kg (130 lb)    LMP 07/13/2016 (Exact Date)    SpO2 100%    BMI 21.63 kg/m  If your blood pressure (BP) was elevated above 135/85 this visit, please have this repeated by your doctor within one month. --------------

## 2016-07-25 NOTE — ED Triage Notes (Signed)
Pt reports frequency and dysuria since this morning.  Denies n/v/d.  States some suprapubic aches/"discomfort"

## 2016-07-27 LAB — URINE CULTURE

## 2016-07-28 ENCOUNTER — Telehealth: Payer: Self-pay | Admitting: Emergency Medicine

## 2016-07-28 NOTE — Telephone Encounter (Signed)
Post ED Visit - Positive Culture Follow-up: Successful Patient Follow-Up  Culture assessed and recommendations reviewed by: []  Enzo BiNathan Batchelder, Pharm.D. []  Celedonio MiyamotoJeremy Frens, Pharm.D., BCPS AQ-ID []  Garvin FilaMike Maccia, Pharm.D., BCPS []  Georgina PillionElizabeth Martin, [address] BCPS []  [place] Springs[place]h Pham, [address]Ph[address]D., BCPS, AAHIVP []  Estella HuskMichelle Turner, Pharm.D., BCPS, AAHIVP []  Lysle Pearlachel Rumbarger, PharmD, BCPS []  Casilda Carlsaylor Stone, PharmD, BCPS []  Pollyann SamplesAndy Johnston, PharmD, BCPS  Positive urine culture  []  Patient discharged without antimicrobial prescription and treatment is now indicated [x]  Organism is resistant to prescribed ED discharge antimicrobial []  Patient with positive blood cultures  Changes discussed with ED provider: Frederik PearMia McDonald PA New antibiotic prescription d/c keflex, Start levofloxacin 750mg  q daily x 3 days [facility] [address]    Sara Coleman, Sara Coleman 07/28/2016, 2:39 PM

## 2016-07-28 NOTE — Progress Notes (Signed)
ED Antimicrobial Stewardship Positive Culture Follow Up   Sara Coleman is an 31 y.o. female who [facility] with a chief complaint of  Chief Complaint  Patient presents with  . Dysuria    Recent Results (from the past 720 hour(s))  Urine culture     Status: Abnormal   Collection Time: 07/25/16  2:22 PM  Result Value Ref Range Status   Specimen Description URINE, CLEAN CATCH  Final   Special Requests NONE  Final   Culture 60,000 COLONIES/mL ESCHERICHIA COLI (A)  Final   Report Status 07/27/2016 FINAL  Final   Organism ID, Bacteria ESCHERICHIA COLI (A)  Final      Susceptibility   Escherichia coli - MIC*    AMPICILLIN >=32 RESISTANT Resistant     CEFAZOLIN >=64 RESISTANT Resistant     CEFTRIAXONE <=1 SENSITIVE Sensitive     CIPROFLOXACIN <=0.25 SENSITIVE Sensitive     GENTAMICIN <=1 SENSITIVE Sensitive     IMIPENEM <=0.25 SENSITIVE Sensitive     NITROFURANTOIN <=16 SENSITIVE Sensitive     TRIMETH/SULFA <=20 SENSITIVE Sensitive     AMPICILLIN/SULBACTAM >=32 RESISTANT Resistant     PIP/TAZO 8 SENSITIVE Sensitive     Extended ESBL NEGATIVE Sensitive     * 60,000 COLONIES/mL ESCHERICHIA COLI    [x]  Treated with cephalexin, organism resistant to prescribed antimicrobial []  Patient discharged originally without antimicrobial agent and treatment is now indicated  New antibiotic prescription: dc cephalexin, add levofloxacin 750 mg qd x 3 days  ED Provider: Frederik PearMia McDonald, PA-C  Sara Coleman 07/28/2016, 8:26 AM Infectious Diseases Pharmacist Phone# [phone]

## 2017-07-26 ENCOUNTER — Other Ambulatory Visit: Payer: Self-pay

## 2017-07-26 ENCOUNTER — Encounter (HOSPITAL_BASED_OUTPATIENT_CLINIC_OR_DEPARTMENT_OTHER): Payer: Self-pay | Admitting: *Deleted

## 2017-07-26 ENCOUNTER — Emergency Department (HOSPITAL_BASED_OUTPATIENT_CLINIC_OR_DEPARTMENT_OTHER)
Admission: EM | Admit: 2017-07-26 | Discharge: 2017-07-26 | Disposition: A | Discharge status: Home / Self Care | Payer: Self-pay | Attending: Emergency Medicine | Admitting: Emergency Medicine

## 2017-07-26 DIAGNOSIS — B3731 Acute candidiasis of vulva and vagina: Secondary | ICD-10-CM

## 2017-07-26 DIAGNOSIS — B9689 Other specified bacterial agents as the cause of diseases classified elsewhere: Secondary | ICD-10-CM

## 2017-07-26 DIAGNOSIS — N760 Acute vaginitis: Secondary | ICD-10-CM | POA: Insufficient documentation

## 2017-07-26 DIAGNOSIS — B373 Candidiasis of vulva and vagina: Secondary | ICD-10-CM | POA: Insufficient documentation

## 2017-07-26 LAB — URINALYSIS, ROUTINE W REFLEX MICROSCOPIC
Bilirubin Urine: NEGATIVE
Glucose, UA: NEGATIVE mg/dL
Ketones, ur: NEGATIVE mg/dL
Leukocytes, UA: NEGATIVE
Nitrite: NEGATIVE
Protein, ur: NEGATIVE mg/dL
Specific Gravity, Urine: 1.025 (ref 1.005–1.030)
pH: 6 (ref 5.0–8.0)

## 2017-07-26 LAB — URINALYSIS, MICROSCOPIC (REFLEX): WBC, UA: NONE SEEN WBC/hpf (ref 0–5)

## 2017-07-26 LAB — PREGNANCY, URINE: Preg Test, Ur: NEGATIVE

## 2017-07-26 LAB — WET PREP, GENITAL
SPERM: NONE SEEN
TRICH WET PREP: NONE SEEN
YEAST WET PREP: NONE SEEN

## 2017-07-26 MED ORDER — FLUCONAZOLE 150 MG PO TABS: 150.0000 mg | ORAL_TABLET | Freq: Once | ORAL | 0 refills | Status: AC

## 2017-07-26 MED ORDER — METRONIDAZOLE 500 MG PO TABS: 500.0000 mg | ORAL_TABLET | Freq: Two times a day (BID) | ORAL | 0 refills | Status: AC

## 2017-07-26 MED ORDER — FLUCONAZOLE 50 MG PO TABS
150.0000 mg | ORAL_TABLET | Freq: Once | ORAL | Status: AC
  Administered 2017-07-26: 150 mg via ORAL
  Filled 2017-07-26: qty 1

## 2017-07-26 NOTE — ED Notes (Signed)
ED Provider at bedside. 

## 2017-07-26 NOTE — Discharge Instructions (Signed)
Please take Flagyl twice daily for the next 7 days to treat for bacterial vaginosis, do not take alcohol while on this medication.  In 3 days please take additional dose of Diflucan to help ensure resolution of yeast infection.  You have STD testing pending and will be called in 2 to 3 days with any positive results.  Return for worsening discharge, abdominal pain, fevers or vomiting or any other new or concerning symptoms.

## 2017-07-26 NOTE — ED Provider Notes (Signed)
[facility] EMERGENCY DEPARTMENT Provider Note   CSN: [id number] Arrival date & time: 07/26/17  1524     History   Chief Complaint Chief Complaint  Patient presents with  . Vaginal Discharge    HPI Sara Coleman is a 32 y.o. female.  Sara Coleman is a 32 y.o. Female with a history of BV, trichomoniasis and migraines, presents to the emergency department for evaluation of 3 days of vaginal discharge.  She reports for the past 3 days she has had large amounts of thick white discharge, she reports associated vaginal irritation, but denies abdominal pain or cramping.  No dysuria or urinary frequency.  No fevers or chills, nausea, vomiting, diarrhea, melena or hematochezia.  Patient reports she was sexually active just prior to discharge starting, has been with the same partner for a long time, does not regularly use protection.     Past Medical History:  Diagnosis Date  . BV (bacterial vaginosis)   . Migraine    otc med prn  . Trichomonas infection 2015    Patient Active Problem List   Diagnosis Date Noted  . 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 CESAREAN SECTION     for breech  . CESAREAN SECTION  10/28/2010   Procedure: CESAREAN SECTION;  Surgeon: Fortino SicEleanor E Greene, MD;  Location: [facility];  Service: Gynecology;  Laterality: N/A;  REPEAT INTRAUTERINE FETAL DEMISE  . CESAREAN SECTION N/A 05/24/2012   Procedure: CESAREAN SECTION;  Surgeon: Fortino SicEleanor E Greene, MD;  Location: [facility];  Service: Obstetrics;  Laterality: N/A;     OB History    Gravida  4   Para  3   Term  3   Preterm  0   AB  1   Living  2     SAB  0   TAB  1   Ectopic  0   Multiple  0   Live Births  1            Home Medications    Prior to Admission medications   Medication Sig Start Date End Date Taking? Authorizing Provider  metroNIDAZOLE (FLAGYL) 500 MG tablet Take 1 tablet  (500 mg total) by mouth 2 (two) times daily. One po bid x 7 days 07/26/17   Dartha LodgeFord, Kelsey N, PA-C  phenazopyridine (PYRIDIUM) 200 MG tablet Take 1 tablet (200 mg total) by mouth 3 (three) times daily. 12/04/15   Geoffery Lyonselo, Douglas, MD    Family History No family history on file.  Social History Social History   Tobacco Use  . Smoking status: Never Smoker  . Smokeless tobacco: Never Used  Substance Use Topics  . Alcohol use: Yes    Comment: weekly  . Drug use: No     Allergies   Patient has no known allergies.   Review of Systems Review of Systems  Constitutional: Negative for chills and fever.  Gastrointestinal: Negative for abdominal pain, constipation, diarrhea, nausea and vomiting.  Genitourinary: Positive for vaginal discharge and vaginal pain. Negative for dysuria, frequency, menstrual problem, pelvic pain and vaginal bleeding.  Musculoskeletal: Negative for arthralgias, back pain and myalgias.  Skin: Negative for color change and rash.  All other systems reviewed and are negative.    Physical Exam Updated Vital Signs BP 127/86 (BP Location: Right Arm)   Pulse 80   Temp 98.8 F (37.1 C) (Oral)   Resp 18   Ht  5\' 8"  (1.727 m)   Wt 68 kg (150 lb)   LMP 06/17/2017   SpO2 100%   BMI 22.81 kg/m   Physical Exam  Constitutional: She appears well-developed and well-nourished. No distress.  HENT:  Head: Normocephalic and atraumatic.  Mouth/Throat: Oropharynx is clear and moist.  Eyes: Right eye exhibits no discharge. Left eye exhibits no discharge.  Cardiovascular: Normal rate, regular rhythm, normal heart sounds and intact distal pulses.  Pulmonary/Chest: Effort normal and breath sounds normal. No respiratory distress.  Respirations equal and unlabored, patient able to speak in full sentences, lungs clear to auscultation bilaterally  Abdominal: Soft. Bowel sounds are normal. She exhibits no distension and no mass. There is no tenderness. There is no guarding.  Abdomen  soft, nondistended, nontender to palpation in all quadrants without guarding or peritoneal signs  Genitourinary:  Genitourinary Comments: Chaperone present during pelvic exam No external genital lesions noted Speculum exam reveals copious amounts of thick white discharge erythema of the vaginal walls, no evidence of cervicitis On bimanual exam there is no cervical motion tenderness or focal uterine or adnexal tenderness or masses.  Neurological: She is alert. Coordination normal.  Skin: Skin is warm and dry. Capillary refill takes less than 2 seconds. She is not diaphoretic.  Psychiatric: She has a normal mood and affect. Her behavior is normal.  Nursing note and vitals reviewed.    ED Treatments / Results  Labs (all labs ordered are listed, but only abnormal results are displayed) Labs Reviewed  WET PREP, GENITAL - Abnormal; Notable for the following components:      Result Value   Clue Cells Wet Prep HPF POC PRESENT (*)    WBC, Wet Prep HPF POC FEW (*)    All other components within normal limits  URINALYSIS, ROUTINE W REFLEX MICROSCOPIC - Abnormal; Notable for the following components:   Hgb urine dipstick TRACE (*)    All other components within normal limits  URINALYSIS, MICROSCOPIC (REFLEX) - Abnormal; Notable for the following components:   Bacteria, UA RARE (*)    All other components within normal limits  PREGNANCY, URINE  RPR  HIV ANTIBODY (ROUTINE TESTING)  GC/CHLAMYDIA PROBE AMP ([place]) NOT AT [facility]    EKG None  Radiology No results found.  Procedures Procedures (including critical care time)  Medications Ordered in ED Medications  fluconazole (DIFLUCAN) tablet 150 mg (150 mg Oral Given 07/26/17 1931)     Initial Impression / Assessment and Plan / ED Course  I have reviewed the triage vital signs and the nursing notes.  Pertinent labs & imaging results that were available during my care of the patient were reviewed by me and considered in my medical  decision making (see chart for details).  Patient presents with 3 days of vaginal discharge.  No associated fevers, vomiting or abdominal pain.  Pelvic exam with copious amounts of thick white discharge very much concerning for yeast infection, wet prep and STD testing collected, no cervical motion tenderness or adnexal tenderness to suggest PID.  Wet prep returned with clue cells and WBCs, urinalysis not concerning for infection.  Although there is no yeast visualized on the wet prep given patient's exam will treat with Diflucan as well as Flagyl.  Patient is aware she has STD testing pending will be contacted in 2 to 3 days with positive results.  Return precautions discussed.  Patient expresses understanding and is in agreement with plan.  Final Clinical Impressions(s) / ED Diagnoses   Final diagnoses:  BV (bacterial vaginosis)  Vaginal yeast infection    ED Discharge Orders        Ordered    fluconazole (DIFLUCAN) 150 MG tablet   Once     07/26/17 1919    metroNIDAZOLE (FLAGYL) 500 MG tablet  2 times daily     07/26/17 1919       Legrand Rams 07/27/17 0036    Raeford Razor, MD 08/02/17 [phone]

## 2017-07-26 NOTE — ED Triage Notes (Signed)
Vaginal discharge x 3 days 

## 2017-07-27 LAB — GC/CHLAMYDIA PROBE AMP (~~LOC~~) NOT AT ARMC
Chlamydia: NEGATIVE
Neisseria Gonorrhea: NEGATIVE

## 2017-07-27 LAB — RPR: RPR Ser Ql: NONREACTIVE

## 2017-07-27 LAB — HIV ANTIBODY (ROUTINE TESTING W REFLEX): HIV SCREEN 4TH GENERATION: NONREACTIVE
